# Patient Record
Sex: Female | Born: 1975 | Race: Black or African American | Hispanic: No | Marital: Single | Smoking: Never smoker
Health system: Southern US, Community
[De-identification: ages and names within clinical notes are randomized; demographics above are authoritative.]

## PROBLEM LIST (undated history)

## (undated) DIAGNOSIS — I10 Essential (primary) hypertension: Secondary | ICD-10-CM

## (undated) DIAGNOSIS — E669 Obesity, unspecified: Secondary | ICD-10-CM

## (undated) DIAGNOSIS — G43009 Migraine without aura, not intractable, without status migrainosus: Secondary | ICD-10-CM

## (undated) HISTORY — PX: WISDOM TOOTH EXTRACTION: SHX21

## (undated) HISTORY — DX: Migraine without aura, not intractable, without status migrainosus: G43.009

## (undated) HISTORY — PX: PLANTAR FASCIA RELEASE: SHX2239

## (undated) HISTORY — DX: Obesity, unspecified: E66.9

---

## 1997-08-04 ENCOUNTER — Other Ambulatory Visit: Admission: RE | Admit: 1997-08-04 | Discharge: 1997-08-04 | Payer: Self-pay | Admitting: *Deleted

## 1998-11-15 ENCOUNTER — Other Ambulatory Visit: Admission: RE | Admit: 1998-11-15 | Discharge: 1998-11-15 | Payer: Self-pay | Admitting: Obstetrics and Gynecology

## 2000-07-06 ENCOUNTER — Encounter: Payer: Self-pay | Admitting: Obstetrics and Gynecology

## 2000-07-06 ENCOUNTER — Encounter: Admission: RE | Admit: 2000-07-06 | Discharge: 2000-07-06 | Payer: Self-pay | Admitting: Obstetrics and Gynecology

## 2001-07-12 ENCOUNTER — Other Ambulatory Visit: Admission: RE | Admit: 2001-07-12 | Discharge: 2001-07-12 | Payer: Self-pay | Admitting: Obstetrics and Gynecology

## 2002-07-28 ENCOUNTER — Other Ambulatory Visit: Admission: RE | Admit: 2002-07-28 | Discharge: 2002-07-28 | Payer: Self-pay | Admitting: Obstetrics and Gynecology

## 2003-10-17 ENCOUNTER — Other Ambulatory Visit: Admission: RE | Admit: 2003-10-17 | Discharge: 2003-10-17 | Payer: Self-pay | Admitting: Obstetrics and Gynecology

## 2004-08-16 ENCOUNTER — Ambulatory Visit (HOSPITAL_COMMUNITY): Admission: RE | Admit: 2004-08-16 | Discharge: 2004-08-16 | Payer: Self-pay | Admitting: Obstetrics and Gynecology

## 2004-10-17 ENCOUNTER — Other Ambulatory Visit: Admission: RE | Admit: 2004-10-17 | Discharge: 2004-10-17 | Payer: Self-pay | Admitting: Obstetrics and Gynecology

## 2005-07-19 ENCOUNTER — Emergency Department (HOSPITAL_COMMUNITY): Admission: EM | Admit: 2005-07-19 | Discharge: 2005-07-20 | Payer: Self-pay | Admitting: Emergency Medicine

## 2007-06-09 ENCOUNTER — Emergency Department (HOSPITAL_COMMUNITY): Admission: EM | Admit: 2007-06-09 | Discharge: 2007-06-09 | Payer: Self-pay | Admitting: Family Medicine

## 2007-12-10 ENCOUNTER — Emergency Department (HOSPITAL_COMMUNITY): Admission: EM | Admit: 2007-12-10 | Discharge: 2007-12-10 | Payer: Self-pay | Admitting: Emergency Medicine

## 2010-10-03 ENCOUNTER — Inpatient Hospital Stay (INDEPENDENT_AMBULATORY_CARE_PROVIDER_SITE_OTHER)
Admission: RE | Admit: 2010-10-03 | Discharge: 2010-10-03 | Disposition: A | Discharge status: Home / Self Care | Payer: 59 | Source: Ambulatory Visit | Attending: Family Medicine | Admitting: Family Medicine

## 2010-10-03 DIAGNOSIS — L988 Other specified disorders of the skin and subcutaneous tissue: Secondary | ICD-10-CM

## 2010-12-23 LAB — POCT RAPID STREP A: Streptococcus, Group A Screen (Direct): NEGATIVE

## 2011-01-01 LAB — BASIC METABOLIC PANEL
CO2: 25
Calcium: 9.1
Chloride: 107
GFR calc Af Amer: >60
Glucose, Bld: 94
Sodium: 137

## 2011-01-01 LAB — DIFFERENTIAL
Basophils Absolute: 0.1
Basophils Relative: 1
Eosinophils Absolute: 0.1
Eosinophils Relative: 1
Lymphocytes Relative: 17
Lymphs Abs: 1.6
Monocytes Absolute: 0.6
Monocytes Relative: 6
Neutro Abs: 7
Neutrophils Relative %: 75

## 2011-01-01 LAB — CBC
HCT: 37.6
Hemoglobin: 12
MCHC: 32.1
MCV: 76.1 — ABNORMAL LOW
Platelets: 215
RBC: 4.94
RDW: 14.9
WBC: 9.3

## 2011-01-01 LAB — WET PREP, GENITAL: Yeast Wet Prep HPF POC: NONE SEEN

## 2011-01-01 LAB — BASIC METABOLIC PANEL WITH GFR
BUN: 10
Creatinine, Ser: 0.88
GFR calc non Af Amer: >60
Potassium: 4.1

## 2011-01-01 LAB — URINALYSIS, ROUTINE W REFLEX MICROSCOPIC
Bilirubin Urine: NEGATIVE
Hgb urine dipstick: NEGATIVE
Ketones, ur: NEGATIVE
Nitrite: NEGATIVE
Urobilinogen, UA: 1

## 2011-02-18 ENCOUNTER — Encounter: Payer: Self-pay | Admitting: *Deleted

## 2011-02-18 ENCOUNTER — Emergency Department (INDEPENDENT_AMBULATORY_CARE_PROVIDER_SITE_OTHER): Payer: 59

## 2011-02-18 ENCOUNTER — Emergency Department (HOSPITAL_BASED_OUTPATIENT_CLINIC_OR_DEPARTMENT_OTHER)
Admission: EM | Admit: 2011-02-18 | Discharge: 2011-02-18 | Disposition: A | Discharge status: Home / Self Care | Payer: 59 | Attending: Emergency Medicine | Admitting: Emergency Medicine

## 2011-02-18 DIAGNOSIS — J40 Bronchitis, not specified as acute or chronic: Secondary | ICD-10-CM

## 2011-02-18 DIAGNOSIS — R059 Cough, unspecified: Secondary | ICD-10-CM | POA: Insufficient documentation

## 2011-02-18 DIAGNOSIS — R0989 Other specified symptoms and signs involving the circulatory and respiratory systems: Secondary | ICD-10-CM

## 2011-02-18 DIAGNOSIS — R05 Cough: Secondary | ICD-10-CM

## 2011-02-18 DIAGNOSIS — R0602 Shortness of breath: Secondary | ICD-10-CM

## 2011-02-18 DIAGNOSIS — Z79899 Other long term (current) drug therapy: Secondary | ICD-10-CM | POA: Insufficient documentation

## 2011-02-18 DIAGNOSIS — J069 Acute upper respiratory infection, unspecified: Secondary | ICD-10-CM | POA: Insufficient documentation

## 2011-02-18 MED ORDER — ALBUTEROL SULFATE HFA 108 (90 BASE) MCG/ACT IN AERS
2.0000 | INHALATION_SPRAY | Freq: Once | RESPIRATORY_TRACT | Status: AC
  Administered 2011-02-18: 2 via RESPIRATORY_TRACT
  Filled 2011-02-18: qty 6.7

## 2011-02-18 MED ORDER — PREDNISONE 50 MG PO TABS
60.0000 mg | ORAL_TABLET | Freq: Once | ORAL | Status: AC
  Administered 2011-02-18: 60 mg via ORAL
  Filled 2011-02-18: qty 1

## 2011-02-18 MED ORDER — PREDNISONE 20 MG PO TABS: 40.0000 mg | ORAL_TABLET | Freq: Every day | ORAL | Status: AC

## 2011-02-18 MED ORDER — HYDROCOD POLST-CHLORPHEN POLST 10-8 MG/5ML PO LQCR: 5.0000 mL | Freq: Two times a day (BID) | ORAL | Status: DC | PRN

## 2011-02-18 NOTE — ED Provider Notes (Signed)
Medical screening examination/treatment/procedure(s) were performed by non-physician practitioner and as supervising physician I was immediately available for consultation/collaboration.   Tita Terhaar M Lacresia Darwish, MD 02/18/11 2310 

## 2011-02-18 NOTE — ED Notes (Signed)
Pt c/o cold  symptoms x 3 weeks w/o relief with z pak

## 2011-02-18 NOTE — ED Provider Notes (Signed)
History     CSN: [id number] Arrival date & time: 02/18/2011  6:19 PM   First MD Initiated Contact with Patient 02/18/11 1824      Chief Complaint  Patient presents with  . URI    (Consider location/radiation/quality/duration/timing/severity/associated sxs/prior treatment) Patient is a 35 y.o. female presenting with URI. The history is provided by the patient.  URI The primary symptoms include cough. Primary symptoms do not include fever, headaches, ear pain, sore throat, wheezing, abdominal pain, nausea, vomiting or rash. The current episode started more than 1 week ago. The problem has not changed since onset. The illness is not associated with chills, sinus pressure, congestion or rhinorrhea.  Pt states she has had cough for 3 weeks. Was seen by PCP a week ago, was started on Z-pack and given cough medications. States cough is not improving, but worsening. Pt unable to sleep at night. Denies fever, chills, other URI symptoms.   History reviewed. No pertinent past medical history.  History reviewed. No pertinent past surgical history.  History reviewed. No pertinent family history.  History  Substance Use Topics  . Smoking status: Never Smoker   . Smokeless tobacco: Not on file  . Alcohol Use: No    OB History    Grav Para Term Preterm Abortions TAB SAB Ect Mult Living                  Review of Systems  Constitutional: Negative for fever and chills.  HENT: Negative for ear pain, congestion, sore throat, rhinorrhea, sneezing, trouble swallowing and sinus pressure.   Eyes: Negative.   Respiratory: Positive for cough. Negative for chest tightness, shortness of breath and wheezing.   Cardiovascular: Negative for chest pain, palpitations and leg swelling.  Gastrointestinal: Negative for nausea, vomiting and abdominal pain.  Genitourinary: Negative.   Musculoskeletal: Negative.   Skin: Negative for rash.  Neurological: Negative for headaches.  Psychiatric/Behavioral:  Negative.     Allergies  Penicillins cross reactors  Home Medications   Current Outpatient Rx  Name Route Sig Dispense Refill  . AZITHROMYCIN 250 MG PO TABS Oral Take 250 mg by mouth daily.      . GUAIFENESIN-CODEINE 100-10 MG/5ML PO SYRP Oral Take 10 mLs by mouth 3 (three) times daily as needed. For cough     . IBUPROFEN 200 MG PO TABS Oral Take 400 mg by mouth every 6 (six) hours as needed. For pain     . NAPROXEN SODIUM 550 MG PO TABS Oral Take 550 mg by mouth 2 (two) times daily with a meal.      . HYDROCOD POLST-CHLORPHEN POLST 10-8 MG/5ML PO LQCR Oral Take 5 mLs by mouth every 12 (twelve) hours as needed. 140 mL 0  . PREDNISONE 20 MG PO TABS Oral Take 2 tablets (40 mg total) by mouth daily. 10 tablet 0    BP 145/100  Pulse 99  Temp 98.7 F (37.1 C)  Resp 16  Ht 5\' 10"  (1.778 m)  Wt 265 lb (120.203 kg)  BMI 38.02 kg/m2  SpO2 100%  LMP 01/23/2011  Physical Exam  Nursing note and vitals reviewed. Constitutional: She is oriented to person, place, and time. She appears well-developed and well-nourished.       coughing  Neck: Neck supple.  Cardiovascular: Normal rate, regular rhythm and normal heart sounds.   Pulmonary/Chest: Effort normal. She has no wheezes. She has no rales. She exhibits no tenderness.       Tachypnic, decreased air movement bilaterally  Abdominal: Soft. Bowel sounds are normal. There is no tenderness.  Neurological: She is alert and oriented to person, place, and time.  Skin: Skin is warm and dry. No rash noted.  Psychiatric: She has a normal mood and affect.    ED Course  Procedures (including critical care time)  Labs Reviewed - No data to display Dg Chest 2 View  02/18/2011  *RADIOLOGY REPORT*  Clinical Data: Cough.  Shortness of breath.  Chest congestion.  3- week history of these symptoms.  CHEST - 2 VIEW 02/18/2011:  Comparison: None.  Findings: Cardiomediastinal silhouette unremarkable.  Mild to moderate central peribronchial thickening.   Lungs otherwise clear. No pleural effusions.  Visualized bony thorax intact.  IMPRESSION: Mild to moderate changes of bronchitis and/or asthma.  No acute cardiopulmonary disease otherwise.  Original Report Authenticated By: Arnell Sieving, M.D.   Pt finished z-pack and cough meds, symptoms worsening. Pt does not have fever, chills. Cough sounds dry, suspect bronchospasms. Will treat with albuterol and prednisone. Two puffs of albuterol given in ED, which stopped pt's cough. Pt feeling slightly better. Will d/c with follow up.   1. Bronchitis       MDM          Lottie Mussel, PA 02/18/11 2248

## 2011-06-23 ENCOUNTER — Emergency Department (HOSPITAL_BASED_OUTPATIENT_CLINIC_OR_DEPARTMENT_OTHER)
Admission: EM | Admit: 2011-06-23 | Discharge: 2011-06-23 | Disposition: A | Discharge status: Home / Self Care | Payer: 59 | Attending: Emergency Medicine | Admitting: Emergency Medicine

## 2011-06-23 ENCOUNTER — Encounter (HOSPITAL_BASED_OUTPATIENT_CLINIC_OR_DEPARTMENT_OTHER): Payer: Self-pay | Admitting: Emergency Medicine

## 2011-06-23 DIAGNOSIS — R112 Nausea with vomiting, unspecified: Secondary | ICD-10-CM | POA: Insufficient documentation

## 2011-06-23 DIAGNOSIS — R109 Unspecified abdominal pain: Secondary | ICD-10-CM | POA: Insufficient documentation

## 2011-06-23 LAB — URINALYSIS, ROUTINE W REFLEX MICROSCOPIC
Glucose, UA: NEGATIVE mg/dL
Ketones, ur: NEGATIVE mg/dL
Leukocytes, UA: NEGATIVE
Nitrite: NEGATIVE
Specific Gravity, Urine: 1.016 (ref 1.005–1.030)
pH: 6.5 (ref 5.0–8.0)

## 2011-06-23 LAB — PREGNANCY, URINE: Preg Test, Ur: NEGATIVE

## 2011-06-23 MED ORDER — ONDANSETRON HCL 4 MG/2ML IJ SOLN
4.0000 mg | Freq: Once | INTRAMUSCULAR | Status: AC
  Administered 2011-06-23: 4 mg via INTRAVENOUS
  Filled 2011-06-23: qty 2

## 2011-06-23 MED ORDER — KETOROLAC TROMETHAMINE 30 MG/ML IJ SOLN
30.0000 mg | Freq: Once | INTRAMUSCULAR | Status: AC
  Administered 2011-06-23: 30 mg via INTRAVENOUS
  Filled 2011-06-23: qty 1

## 2011-06-23 MED ORDER — SODIUM CHLORIDE 0.9 % IV BOLUS (SEPSIS)
1000.0000 mL | Freq: Once | INTRAVENOUS | Status: AC
  Administered 2011-06-23: 1000 mL via INTRAVENOUS

## 2011-06-23 MED ORDER — ONDANSETRON HCL 4 MG PO TABS: 4.0000 mg | ORAL_TABLET | Freq: Four times a day (QID) | ORAL | Status: AC

## 2011-06-23 NOTE — ED Notes (Signed)
Pt c/o lower abdomen pain and vomiting since 3am.

## 2011-06-23 NOTE — Discharge Instructions (Signed)
Abdominal Pain Abdominal pain can be caused by many things. Your caregiver decides the seriousness of your pain by an examination and possibly blood tests and X-rays. Many cases can be observed and treated at home. Most abdominal pain is not caused by a disease and will probably improve without treatment. However, in many cases, more time must pass before a clear cause of the pain can be found. Before that point, it may not be known if you need more testing, or if hospitalization or surgery is needed. HOME CARE INSTRUCTIONS   Do not take laxatives unless directed by your caregiver.   Take pain medicine only as directed by your caregiver.   Only take over-the-counter or prescription medicines for pain, discomfort, or fever as directed by your caregiver.   Try a clear liquid diet (broth, tea, or water) for as long as directed by your caregiver. Slowly move to a bland diet as tolerated.  SEEK IMMEDIATE MEDICAL CARE IF:   The pain does not go away.   You have a fever.   You keep throwing up (vomiting).   The pain is felt only in portions of the abdomen. Pain in the right side could possibly be appendicitis. In an adult, pain in the left lower portion of the abdomen could be colitis or diverticulitis.   You pass bloody or black tarry stools.  MAKE SURE YOU:   Understand these instructions.   Will watch your condition.   Will get help right away if you are not doing well or get worse.  Document Released: 12/25/2004 Document Revised: 03/06/2011 Document Reviewed: 11/03/2007 [employer] Patient Information 2012 [employer], [place]  Viral Gastroenteritis Viral gastroenteritis is also known as stomach flu. This condition affects the stomach and intestinal tract. It can cause sudden diarrhea and vomiting. The illness typically lasts 3 to 8 days. Most people develop an immune response that eventually gets rid of the virus. While this natural response develops, the virus can make you quite ill. CAUSES    Many different viruses can cause gastroenteritis, such as rotavirus or noroviruses. You can catch one of these viruses by consuming contaminated food or water. You may also catch a virus by sharing utensils or other personal items with an infected person or by touching a contaminated surface. SYMPTOMS  The most common symptoms are diarrhea and vomiting. These problems can cause a severe loss of body fluids (dehydration) and a body salt (electrolyte) imbalance. Other symptoms may include:  Fever.   Headache.   Fatigue.   Abdominal pain.  DIAGNOSIS  Your caregiver can usually diagnose viral gastroenteritis based on your symptoms and a physical exam. A stool sample may also be taken to test for the presence of viruses or other infections. TREATMENT  This illness typically goes away on its own. Treatments are aimed at rehydration. The most serious cases of viral gastroenteritis involve vomiting so severely that you are not able to keep fluids down. In these cases, fluids must be given through an intravenous line (IV). HOME CARE INSTRUCTIONS   Drink enough fluids to keep your urine clear or pale yellow. Drink small amounts of fluids frequently and increase the amounts as tolerated.   Ask your caregiver for specific rehydration instructions.   Avoid:   Foods high in sugar.   Alcohol.   Carbonated drinks.   Tobacco.   Juice.   Caffeine drinks.   Extremely hot or cold fluids.   Fatty, greasy foods.   Too much intake of anything at one time.  Dairy products until 24 to 48 hours after diarrhea stops.   You may consume probiotics. Probiotics are active cultures of beneficial bacteria. They may lessen the amount and number of diarrheal stools in adults. Probiotics can be found in yogurt with active cultures and in supplements.   Wash your hands well to avoid spreading the virus.   Only take over-the-counter or prescription medicines for pain, discomfort, or fever as directed by  your caregiver. Do not give aspirin to children. Antidiarrheal medicines are not recommended.   Ask your caregiver if you should continue to take your regular prescribed and over-the-counter medicines.   Keep all follow-up appointments as directed by your caregiver.  SEEK IMMEDIATE MEDICAL CARE IF:   You are unable to keep fluids down.   You do not urinate at least once every 6 to 8 hours.   You develop shortness of breath.   You notice blood in your stool or vomit. This may look like coffee grounds.   You have abdominal pain that increases or is concentrated in one small area (localized).   You have persistent vomiting or diarrhea.   You have a fever.   The patient is a child younger than 3 months, and he or she has a fever.   The patient is a child older than 3 months, and he or she has a fever and persistent symptoms.   The patient is a child older than 3 months, and he or she has a fever and symptoms suddenly get worse.   The patient is a baby, and he or she has no tears when crying.  MAKE SURE YOU:   Understand these instructions.   Will watch your condition.   Will get help right away if you are not doing well or get worse.  Document Released: 03/17/2005 Document Revised: 03/06/2011 Document Reviewed: 01/01/2011 [facility] Patient Information 2012 [place], [place].[name]

## 2011-06-23 NOTE — ED Provider Notes (Signed)
History     CSN: [id number]  Arrival date & time 06/23/11  0709   First MD Initiated Contact with Patient 06/23/11 0719      No chief complaint on file.   (Consider location/radiation/quality/duration/timing/severity/associated sxs/prior treatment) HPI Comments: Patient presents with lower abdominal pain and nausea and vomiting since 3 AM.  No diarrhea.  No sick contacts.  Patient felt well last night.  No fevers.  Patient denies dysuria.  Her last menstrual period was the end of last month although she notes she's been spotting this last week.  Patient is a 36 y.o. female presenting with GI illlness. The history is provided by the patient. No language interpreter was used.  GI Problem  This is a new problem. The current episode started 3 to 5 hours ago. The problem occurs 5 to 10 times per day. The problem has not changed since onset.There has been no fever. Associated symptoms include abdominal pain and vomiting. Pertinent negatives include no chills, no sweats, no headaches, no arthralgias, no myalgias, no URI and no cough. She has tried nothing for the symptoms.    History reviewed. No pertinent past medical history.  History reviewed. No pertinent past surgical history.  History reviewed. No pertinent family history.  History  Substance Use Topics  . Smoking status: Never Smoker   . Smokeless tobacco: Not on file  . Alcohol Use: No    OB History    Grav Para Term Preterm Abortions TAB SAB Ect Mult Living                  Review of Systems  Constitutional: Negative.  Negative for fever and chills.  HENT: Negative.   Eyes: Negative.  Negative for discharge and redness.  Respiratory: Negative.  Negative for cough and shortness of breath.   Cardiovascular: Negative.  Negative for chest pain.  Gastrointestinal: Positive for nausea, vomiting and abdominal pain. Negative for diarrhea.  Genitourinary: Negative.  Negative for dysuria and vaginal discharge.  Musculoskeletal:  Negative.  Negative for myalgias, back pain and arthralgias.  Skin: Negative.  Negative for color change and rash.  Neurological: Negative.  Negative for syncope and headaches.  Hematological: Negative.  Negative for adenopathy.  Psychiatric/Behavioral: Negative.  Negative for confusion.  All other systems reviewed and are negative.    Allergies  Penicillins cross reactors  Home Medications   Current Outpatient Rx  Name Route Sig Dispense Refill  . AZITHROMYCIN 250 MG PO TABS Oral Take 250 mg by mouth daily.      [name] HYDROCOD POLST-CPM POLST ER 10-8 MG/5ML PO LQCR Oral Take 5 mLs by mouth every 12 (twelve) hours as needed. 140 mL 0  . GUAIFENESIN-CODEINE 100-10 MG/5ML PO SYRP Oral Take 10 mLs by mouth 3 (three) times daily as needed. For cough     . IBUPROFEN 200 MG PO TABS Oral Take 400 mg by mouth every 6 (six) hours as needed. For pain     . NAPROXEN SODIUM 550 MG PO TABS Oral Take 550 mg by mouth 2 (two) times daily with a meal.        BP 129/88  Pulse 102  Temp(Src) 98.6 F (37 C) (Oral)  Resp 20  Ht 5\' 9"  (1.753 m)  Wt 265 lb (120.203 kg)  BMI 39.13 kg/m2  SpO2 98%  Physical Exam  Nursing note and vitals reviewed. Constitutional: She is oriented to person, place, and time. She appears well-developed and well-nourished.  Non-toxic appearance. She does not have a  sickly appearance.  HENT:  Head: Normocephalic and atraumatic.  Eyes: Conjunctivae, EOM and lids are normal. Pupils are equal, round, and reactive to light. No scleral icterus.  Neck: Trachea normal and normal range of motion. Neck supple.  Cardiovascular: Normal rate, regular rhythm and normal heart sounds.   Pulmonary/Chest: Effort normal and breath sounds normal. No respiratory distress. She has no wheezes. She has no rales.  Abdominal: Soft. Normal appearance. She exhibits no distension. There is no tenderness. There is no rebound, no guarding and no CVA tenderness.  Musculoskeletal: Normal range of motion.    Neurological: She is alert and oriented to person, place, and time. She has normal strength.  Skin: Skin is warm, dry and intact. No rash noted.  Psychiatric: She has a normal mood and affect. Her behavior is normal. Judgment and thought content normal.    ED Course  Procedures (including critical care time)  Results for orders placed during the hospital encounter of 06/23/11  URINALYSIS, ROUTINE W REFLEX MICROSCOPIC      Component Value Range   Color, Urine YELLOW  YELLOW    APPearance CLEAR  CLEAR    Specific Gravity, Urine 1.016  1.005 - 1.030    pH 6.5  5.0 - 8.0    Glucose, UA NEGATIVE  NEGATIVE (mg/dL)   Hgb urine dipstick NEGATIVE  NEGATIVE    Bilirubin Urine NEGATIVE  NEGATIVE    Ketones, ur NEGATIVE  NEGATIVE (mg/dL)   Protein, ur NEGATIVE  NEGATIVE (mg/dL)   Urobilinogen, UA 0.2  0.0 - 1.0 (mg/dL)   Nitrite NEGATIVE  NEGATIVE    Leukocytes, UA NEGATIVE  NEGATIVE   PREGNANCY, URINE      Component Value Range   Preg Test, Ur NEGATIVE  NEGATIVE       MDM  Patient with likely viral gastroenteritis given her symptoms with nausea and vomiting this morning.  Patient does not have an ectopic pregnancy as her pregnancy test is negative and does not have a UTI.  Her abdominal exam is benign and shows no signs of focal right lower quadrant pain to be suspicious for acute appendicitis at this time.  Patient will be given a prescription for antiemetics at home and should return if her symptoms persist.        Nat Christen, MD 06/23/11 [phone]

## 2012-03-26 ENCOUNTER — Emergency Department (HOSPITAL_BASED_OUTPATIENT_CLINIC_OR_DEPARTMENT_OTHER)
Admission: EM | Admit: 2012-03-26 | Discharge: 2012-03-26 | Disposition: A | Discharge status: Home / Self Care | Payer: 59 | Attending: Emergency Medicine | Admitting: Emergency Medicine

## 2012-03-26 ENCOUNTER — Encounter (HOSPITAL_BASED_OUTPATIENT_CLINIC_OR_DEPARTMENT_OTHER): Payer: Self-pay | Admitting: *Deleted

## 2012-03-26 DIAGNOSIS — R51 Headache: Secondary | ICD-10-CM | POA: Insufficient documentation

## 2012-03-26 DIAGNOSIS — R197 Diarrhea, unspecified: Secondary | ICD-10-CM | POA: Insufficient documentation

## 2012-03-26 DIAGNOSIS — K529 Noninfective gastroenteritis and colitis, unspecified: Secondary | ICD-10-CM

## 2012-03-26 DIAGNOSIS — K5289 Other specified noninfective gastroenteritis and colitis: Secondary | ICD-10-CM | POA: Insufficient documentation

## 2012-03-26 DIAGNOSIS — IMO0001 Reserved for inherently not codable concepts without codable children: Secondary | ICD-10-CM | POA: Insufficient documentation

## 2012-03-26 DIAGNOSIS — R509 Fever, unspecified: Secondary | ICD-10-CM | POA: Insufficient documentation

## 2012-03-26 LAB — COMPREHENSIVE METABOLIC PANEL
ALT: 16 U/L (ref 0–35)
AST: 19 U/L (ref 0–37)
Alkaline Phosphatase: 109 U/L (ref 39–117)
CO2: 24 mEq/L (ref 19–32)
Calcium: 9.3 mg/dL (ref 8.4–10.5)
GFR calc non Af Amer: 82 mL/min — ABNORMAL LOW (ref >90)
Glucose, Bld: 104 mg/dL — ABNORMAL HIGH (ref 70–99)
Potassium: 4.3 mEq/L (ref 3.5–5.1)
Sodium: 138 mEq/L (ref 135–145)

## 2012-03-26 LAB — CBC
Hemoglobin: 12.9 g/dL (ref 12.0–15.0)
MCH: 23.8 pg — ABNORMAL LOW (ref 26.0–34.0)
Platelets: 219 10*3/uL (ref 150–400)
RBC: 5.43 MIL/uL — ABNORMAL HIGH (ref 3.87–5.11)
WBC: 10 10*3/uL (ref 4.0–10.5)

## 2012-03-26 MED ORDER — LOPERAMIDE HCL 2 MG PO CAPS: 2.0000 mg | ORAL_CAPSULE | Freq: Four times a day (QID) | ORAL | Status: DC | PRN

## 2012-03-26 MED ORDER — SODIUM CHLORIDE 0.9 % IV BOLUS (SEPSIS)
1000.0000 mL | Freq: Once | INTRAVENOUS | Status: AC
  Administered 2012-03-26: 1000 mL via INTRAVENOUS

## 2012-03-26 MED ORDER — MORPHINE SULFATE 4 MG/ML IJ SOLN
4.0000 mg | Freq: Once | INTRAMUSCULAR | Status: AC
  Administered 2012-03-26: 4 mg via INTRAVENOUS
  Filled 2012-03-26: qty 1

## 2012-03-26 MED ORDER — ONDANSETRON HCL 4 MG PO TABS: 4.0000 mg | ORAL_TABLET | Freq: Four times a day (QID) | ORAL | Status: DC

## 2012-03-26 MED ORDER — IBUPROFEN 400 MG PO TABS
400.0000 mg | ORAL_TABLET | Freq: Once | ORAL | Status: AC
  Administered 2012-03-26: 400 mg via ORAL
  Filled 2012-03-26: qty 1

## 2012-03-26 MED ORDER — ONDANSETRON HCL 4 MG/2ML IJ SOLN
4.0000 mg | Freq: Once | INTRAMUSCULAR | Status: AC
  Administered 2012-03-26: 4 mg via INTRAVENOUS
  Filled 2012-03-26: qty 2

## 2012-03-26 MED ORDER — METOCLOPRAMIDE HCL 5 MG/ML IJ SOLN
INTRAMUSCULAR | Status: AC
  Administered 2012-03-26: 10 mg via INTRAVENOUS
  Filled 2012-03-26: qty 2

## 2012-03-26 MED ORDER — METOCLOPRAMIDE HCL 5 MG/ML IJ SOLN
10.0000 mg | Freq: Once | INTRAMUSCULAR | Status: AC
  Administered 2012-03-26: 10 mg via INTRAVENOUS

## 2012-03-26 NOTE — ED Notes (Signed)
Pt amb to triage with quick steady gait in nad. Pt reports n/v/d x this am with stomach cramps.

## 2012-03-26 NOTE — ED Provider Notes (Signed)
History     CSN: [id number]  Arrival date & time 03/26/12  1610   First MD Initiated Contact with Patient 03/26/12 1652      Chief Complaint  Patient presents with  . Nausea  . Emesis  . Diarrhea    (Consider location/radiation/quality/duration/timing/severity/associated sxs/prior treatment) Patient is a 36 y.o. female presenting with vomiting and diarrhea. The history is provided by the patient and the spouse. No language interpreter was used.  Emesis  This is a new problem. The current episode started 6 to 12 hours ago. The problem occurs 5 to 10 times per day. The problem has not changed since onset.Associated symptoms include diarrhea, a fever, headaches and myalgias. Associated symptoms comments: 5-10 times.  Diarrhea The primary symptoms include fever, vomiting, diarrhea and myalgias.    History reviewed. No pertinent past medical history.  History reviewed. No pertinent past surgical history.  History reviewed. No pertinent family history.  History  Substance Use Topics  . Smoking status: Never Smoker   . Smokeless tobacco: Not on file  . Alcohol Use: No    OB History    Grav Para Term Preterm Abortions TAB SAB Ect Mult Living                  Review of Systems  Constitutional: Positive for fever.  Eyes: Negative.   Respiratory: Negative.   Cardiovascular: Negative.   Gastrointestinal: Positive for vomiting and diarrhea.  Musculoskeletal: Positive for myalgias.  Neurological: Positive for headaches. Negative for dizziness and light-headedness.  Psychiatric/Behavioral: Negative.   All other systems reviewed and are negative.    Allergies  Codeine and Penicillins cross reactors  Home Medications   Current Outpatient Rx  Name  Route  Sig  Dispense  Refill  . AZITHROMYCIN 250 MG PO TABS   Oral   Take 250 mg by mouth daily.           [name] HYDROCOD POLST-CPM POLST ER 10-8 MG/5ML PO LQCR   Oral   Take 5 mLs by mouth every 12 (twelve) hours as  needed.   140 mL   0   . GUAIFENESIN-CODEINE 100-10 MG/5ML PO SYRP   Oral   Take 10 mLs by mouth 3 (three) times daily as needed. For cough          . IBUPROFEN 200 MG PO TABS   Oral   Take 400 mg by mouth every 6 (six) hours as needed. For pain          . NAPROXEN SODIUM 550 MG PO TABS   Oral   Take 550 mg by mouth 2 (two) times daily with a meal.             BP 135/84  Pulse 104  Temp 99.2 F (37.3 C) (Oral)  Resp 18  SpO2 100%  LMP 02/25/2012  Physical Exam  Nursing note and vitals reviewed. Constitutional: She is oriented to person, place, and time. She appears well-developed and well-nourished.  HENT:  Head: Normocephalic and atraumatic.  Eyes: Conjunctivae normal and EOM are normal. Pupils are equal, round, and reactive to light.  Neck: Normal range of motion. Neck supple.  Cardiovascular: Normal rate.   Pulmonary/Chest: Effort normal.  Abdominal: Soft. Bowel sounds are normal. She exhibits distension. There is no tenderness. There is no rebound and no guarding.  Musculoskeletal: Normal range of motion. She exhibits no edema and no tenderness.  Neurological: She is alert and oriented to person, place, and time. She has normal  reflexes.  Skin: Skin is warm and dry.  Psychiatric: She has a normal mood and affect.    ED Course  Procedures (including critical care time)   Labs Reviewed  CBC  COMPREHENSIVE METABOLIC PANEL   No results found.   No diagnosis found.    MDM  Nausea vomiting diarrhea subjective fever since early this morning. Zofran and Reglan in the ER with IV fluid bolus. Tolerating po's.  Patient is ready for discharge. [facility] Renato Gails. Prescription for Zofran and Imodium. Patient was instructed to return to the ER for uncontrolled nausea vomiting high fever.        Remi Haggard, NP 03/27/12 1032

## 2012-03-30 NOTE — ED Provider Notes (Signed)
Medical screening examination/treatment/procedure(s) were performed by non-physician practitioner and as supervising physician I was immediately available for consultation/collaboration.  Ondre Salvetti, MD 03/30/12 1356 

## 2012-06-08 ENCOUNTER — Emergency Department (HOSPITAL_COMMUNITY): Payer: 59

## 2012-06-08 ENCOUNTER — Emergency Department (HOSPITAL_COMMUNITY)
Admission: EM | Admit: 2012-06-08 | Discharge: 2012-06-08 | Disposition: A | Discharge status: Home / Self Care | Payer: 59 | Attending: Emergency Medicine | Admitting: Emergency Medicine

## 2012-06-08 ENCOUNTER — Encounter (HOSPITAL_COMMUNITY): Payer: Self-pay | Admitting: Emergency Medicine

## 2012-06-08 DIAGNOSIS — R109 Unspecified abdominal pain: Secondary | ICD-10-CM

## 2012-06-08 DIAGNOSIS — R1013 Epigastric pain: Secondary | ICD-10-CM | POA: Insufficient documentation

## 2012-06-08 DIAGNOSIS — R509 Fever, unspecified: Secondary | ICD-10-CM | POA: Insufficient documentation

## 2012-06-08 DIAGNOSIS — R112 Nausea with vomiting, unspecified: Secondary | ICD-10-CM | POA: Insufficient documentation

## 2012-06-08 DIAGNOSIS — Z3202 Encounter for pregnancy test, result negative: Secondary | ICD-10-CM | POA: Insufficient documentation

## 2012-06-08 DIAGNOSIS — R197 Diarrhea, unspecified: Secondary | ICD-10-CM | POA: Insufficient documentation

## 2012-06-08 DIAGNOSIS — R51 Headache: Secondary | ICD-10-CM | POA: Insufficient documentation

## 2012-06-08 LAB — CBC WITH DIFFERENTIAL/PLATELET
Basophils Absolute: 0 10*3/uL (ref 0.0–0.1)
Basophils Relative: 0 % (ref 0–1)
Eosinophils Absolute: 0 10*3/uL (ref 0.0–0.7)
Eosinophils Relative: 0 % (ref 0–5)
HCT: 38.6 % (ref 36.0–46.0)
Hemoglobin: 12.7 g/dL (ref 12.0–15.0)
Lymphocytes Relative: 6 % — ABNORMAL LOW (ref 12–46)
Lymphs Abs: 0.6 10*3/uL — ABNORMAL LOW (ref 0.7–4.0)
MCH: 23.6 pg — ABNORMAL LOW (ref 26.0–34.0)
MCHC: 32.9 g/dL (ref 30.0–36.0)
MCV: 71.6 fL — ABNORMAL LOW (ref 78.0–100.0)
Monocytes Absolute: 0.4 10*3/uL (ref 0.1–1.0)
Monocytes Relative: 4 % (ref 3–12)
Neutro Abs: 7.9 10*3/uL — ABNORMAL HIGH (ref 1.7–7.7)
Neutrophils Relative %: 89 % — ABNORMAL HIGH (ref 43–77)
Platelets: 211 10*3/uL (ref 150–400)
RBC: 5.39 MIL/uL — ABNORMAL HIGH (ref 3.87–5.11)
RDW: 15.1 % (ref 11.5–15.5)
WBC: 8.9 10*3/uL (ref 4.0–10.5)

## 2012-06-08 LAB — BASIC METABOLIC PANEL
BUN: 11 mg/dL (ref 6–23)
CO2: 22 mEq/L (ref 19–32)
Calcium: 9.2 mg/dL (ref 8.4–10.5)
Chloride: 100 mEq/L (ref 96–112)
Creatinine, Ser: 0.81 mg/dL (ref 0.50–1.10)
GFR calc Af Amer: >90 mL/min (ref >90)
GFR calc non Af Amer: >90 mL/min (ref >90)
Glucose, Bld: 99 mg/dL (ref 70–99)
Potassium: 3.8 mEq/L (ref 3.5–5.1)
Sodium: 134 mEq/L — ABNORMAL LOW (ref 135–145)

## 2012-06-08 LAB — URINALYSIS, ROUTINE W REFLEX MICROSCOPIC
Bilirubin Urine: NEGATIVE
Glucose, UA: NEGATIVE mg/dL
Hgb urine dipstick: NEGATIVE
Ketones, ur: NEGATIVE mg/dL
Nitrite: NEGATIVE
Protein, ur: NEGATIVE mg/dL
Specific Gravity, Urine: 1.026 (ref 1.005–1.030)
Urobilinogen, UA: 1 mg/dL (ref 0.0–1.0)
pH: 5.5 (ref 5.0–8.0)

## 2012-06-08 LAB — URINE MICROSCOPIC-ADD ON

## 2012-06-08 LAB — POCT PREGNANCY, URINE: Preg Test, Ur: NEGATIVE

## 2012-06-08 MED ORDER — ONDANSETRON HCL 4 MG/2ML IJ SOLN
4.0000 mg | Freq: Once | INTRAMUSCULAR | Status: AC
  Administered 2012-06-08: 4 mg via INTRAVENOUS
  Filled 2012-06-08: qty 2

## 2012-06-08 MED ORDER — KETOROLAC TROMETHAMINE 30 MG/ML IJ SOLN
30.0000 mg | Freq: Once | INTRAMUSCULAR | Status: AC
  Administered 2012-06-08: 30 mg via INTRAVENOUS
  Filled 2012-06-08: qty 1

## 2012-06-08 MED ORDER — SODIUM CHLORIDE 0.9 % IV BOLUS (SEPSIS)
1000.0000 mL | Freq: Once | INTRAVENOUS | Status: AC
  Administered 2012-06-08: 1000 mL via INTRAVENOUS

## 2012-06-08 MED ORDER — ONDANSETRON 8 MG PO TBDP: 8.0000 mg | ORAL_TABLET | Freq: Three times a day (TID) | ORAL | Status: DC | PRN

## 2012-06-08 NOTE — ED Notes (Addendum)
Pt c/o abd pain and nausea that started today around 6am. Pt reports the pain in generalized, cannot pinpoint it. Pt c/o n/v/d/fever all started at 6am this morning. Pt reports she is unable to keep food/fluids down. LBM today. LMP in January, pt reports it is normally irregular. Pt in nad, ambulatory to room, skin warm and dry, resp e/u.

## 2012-06-08 NOTE — ED Provider Notes (Signed)
History     CSN: [id number]  Arrival date & time 06/08/12  1458   First MD Initiated Contact with Patient 06/08/12 1822      Chief Complaint  Patient presents with  . Abdominal Pain  . Diarrhea  . Headache    (Consider location/radiation/quality/duration/timing/severity/associated sxs/prior treatment) HPI Comments: Patient presents today with a chief complaint of nausea, vomiting, diarrhea, and abdominal pain. Onset of symptoms earlier today.   Abdominal pain located in the epigastric area.  Pain does not radiate.  Patient has had a low grade fever.  Tmax 99.9 orally.  Patient reports that she has had approximately 5 episodes of vomiting and 5 episodes of diarrhea.  No blood in her emesis or blood in her stool.  She has not taken anything for her symptoms prior to arrival.  Patient is a 37 y.o. female presenting with abdominal pain, diarrhea, and headaches. The history is provided by the patient.  Abdominal Pain Pain quality: aching   Onset quality:  Gradual Timing:  Constant Context: not previous surgeries   Worsened by:  Nothing tried Associated symptoms: chills, diarrhea, fever, nausea and vomiting   Associated symptoms: no chest pain, no constipation, no dysuria, no hematemesis, no hematochezia, no hematuria, no melena, no shortness of breath, no vaginal bleeding and no vaginal discharge   Vomiting:    Quality:  Stomach contents Risk factors: no alcohol abuse and has not had multiple surgeries   Diarrhea Associated symptoms: abdominal pain, chills, fever, headaches and vomiting   Headache Associated symptoms: abdominal pain, diarrhea, fever, nausea and vomiting     History reviewed. No pertinent past medical history.  History reviewed. No pertinent past surgical history.  No family history on file.  History  Substance Use Topics  . Smoking status: Never Smoker   . Smokeless tobacco: Not on file  . Alcohol Use: No    OB History   Grav Para Term Preterm Abortions  TAB SAB Ect Mult Living                  Review of Systems  Constitutional: Positive for fever and chills.  Respiratory: Negative for shortness of breath.   Cardiovascular: Negative for chest pain.  Gastrointestinal: Positive for nausea, vomiting, abdominal pain and diarrhea. Negative for constipation, blood in stool, melena, hematochezia, abdominal distention and hematemesis.  Genitourinary: Negative for dysuria, urgency, frequency, hematuria, flank pain, decreased urine volume, vaginal bleeding, vaginal discharge and pelvic pain.  Neurological: Positive for headaches.  All other systems reviewed and are negative.    Allergies  Codeine and Penicillins cross reactors  Home Medications  No current outpatient prescriptions on file.  BP 116/71  Pulse 99  Temp(Src) 99.5 F (37.5 C) (Oral)  Resp 18  SpO2 97%  LMP 04/10/2012  Physical Exam  Nursing note and vitals reviewed. Constitutional: She appears well-developed and well-nourished. No distress.  HENT:  Head: Normocephalic and atraumatic.  Mouth/Throat: Oropharynx is clear and moist.  Neck: Normal range of motion. Neck supple.  Cardiovascular: Normal rate, regular rhythm and normal heart sounds.   Pulmonary/Chest: Effort normal and breath sounds normal.  Abdominal: Soft. Normal appearance and bowel sounds are normal. She exhibits no distension and no mass. There is tenderness in the epigastric area. There is no rebound, no guarding, no tenderness at McBurney's point and negative Murphy's sign.  Neurological: She is alert.  Skin: Skin is warm and dry. She is not diaphoretic.  Psychiatric: She has a normal mood and affect.  ED Course  Procedures (including critical care time)  Labs Reviewed  CBC WITH DIFFERENTIAL - Abnormal; Notable for the following:    RBC 5.39 (*)    MCV 71.6 (*)    MCH 23.6 (*)    Neutrophils Relative 89 (*)    Neutro Abs 7.9 (*)    Lymphocytes Relative 6 (*)    Lymphs Abs 0.6 (*)    All  other components within normal limits  BASIC METABOLIC PANEL - Abnormal; Notable for the following:    Sodium 134 (*)    All other components within normal limits  URINALYSIS, ROUTINE W REFLEX MICROSCOPIC - Abnormal; Notable for the following:    Leukocytes, UA TRACE (*)    All other components within normal limits  URINE MICROSCOPIC-ADD ON  POCT PREGNANCY, URINE   Dg Chest 2 View  06/08/2012  *RADIOLOGY REPORT*  Clinical Data: Pain and diarrhea  CHEST - 2 VIEW  Comparison: February 18, 2011  Findings: Lungs clear.  Heart size and pulmonary vascularity are normal.  No adenopathy.  No bone lesions.  IMPRESSION: No abnormality noted.   Original Report Authenticated By: Bretta Bang, M.D.      No diagnosis found.  8:36 PM Patient currently getting IVF.  Patient able to tolerate PO liquids.  MDM  Patient presenting with nausea, vomiting, and diarrhea.  Symptoms began earlier today.  Mild epigastric tenderness on exam.  No rebound or guarding.  Symptoms improved after given Zofran.  Labs unremarkable.  Suspect Viral Gastroenteritis.  Patient discharged home with Rx for Zofran.  Return precautions given.          Pascal Lux [place], PA-C 06/09/12 [phone]

## 2012-06-08 NOTE — ED Notes (Signed)
Onset today general abdominal pain 9/10 achy dull sharp with nausea emesis and diarrhea. Headache 6/10 throbbing.

## 2012-06-10 NOTE — ED Provider Notes (Signed)
Medical screening examination/treatment/procedure(s) were performed by non-physician practitioner and as supervising physician I was immediately available for consultation/collaboration.  Stephen Kohut, MD 06/10/12 0101 

## 2012-06-18 ENCOUNTER — Ambulatory Visit (INDEPENDENT_AMBULATORY_CARE_PROVIDER_SITE_OTHER): Payer: 59 | Admitting: Neurology

## 2012-06-18 ENCOUNTER — Encounter: Payer: Self-pay | Admitting: Neurology

## 2012-06-18 VITALS — BP 119/80 | HR 96 | Ht 69.5 in | Wt 288.0 lb

## 2012-06-18 DIAGNOSIS — G43909 Migraine, unspecified, not intractable, without status migrainosus: Secondary | ICD-10-CM | POA: Insufficient documentation

## 2012-06-18 DIAGNOSIS — G43009 Migraine without aura, not intractable, without status migrainosus: Secondary | ICD-10-CM

## 2012-06-18 HISTORY — DX: Migraine without aura, not intractable, without status migrainosus: G43.009

## 2012-06-18 MED ORDER — SUMATRIPTAN SUCCINATE 6 MG/0.5ML ~~LOC~~ SOLN: 6.0000 mg | Freq: Two times a day (BID) | SUBCUTANEOUS | Status: DC | PRN

## 2012-06-18 MED ORDER — SUMATRIPTAN SUCCINATE 100 MG PO TABS: 100.0000 mg | ORAL_TABLET | Freq: Two times a day (BID) | ORAL | Status: DC | PRN

## 2012-06-18 NOTE — Patient Instructions (Addendum)
   May use the Excedrine migraine with the imitrex.  Migraine Headache A migraine headache is an intense, throbbing pain on one or both sides of your head. A migraine can last for 30 minutes to several hours. CAUSES  The exact cause of a migraine headache is not always known. However, a migraine may be caused when nerves in the brain become irritated and release chemicals that cause inflammation. This causes pain. SYMPTOMS  Pain on one or both sides of your head.  Pulsating or throbbing pain.  Severe pain that prevents daily activities.  Pain that is aggravated by any physical activity.  Nausea, vomiting, or both.  Dizziness.  Pain with exposure to bright lights, loud noises, or activity.  General sensitivity to bright lights, loud noises, or smells. Before you get a migraine, you may get warning signs that a migraine is coming (aura). An aura may include:  Seeing flashing lights.  Seeing bright spots, halos, or zig-zag lines.  Having tunnel vision or blurred vision.  Having feelings of numbness or tingling.  Having trouble talking.  Having muscle weakness. MIGRAINE TRIGGERS  Alcohol.  Smoking.  Stress.  Menstruation.  Aged cheeses.  Foods or drinks that contain nitrates, glutamate, aspartame, or tyramine.  Lack of sleep.  Chocolate.  Caffeine.  Hunger.  Physical exertion.  Fatigue.  Medicines used to treat chest pain (nitroglycerine), birth control pills, estrogen, and some blood pressure medicines. DIAGNOSIS  A migraine headache is often diagnosed based on:  Symptoms.  Physical examination.  A CT scan or MRI of your head. TREATMENT Medicines may be given for pain and nausea. Medicines can also be given to help prevent recurrent migraines.  HOME CARE INSTRUCTIONS  Only take over-the-counter or prescription medicines for pain or discomfort as directed by your caregiver. The use of long-term narcotics is not recommended.  Lie down in a dark,  quiet room when you have a migraine.  Keep a journal to find out what may trigger your migraine headaches. For example, write down:  What you eat and drink.  How much sleep you get.  Any change to your diet or medicines.  Limit alcohol consumption.  Quit smoking if you smoke.  Get 7 to 9 hours of sleep, or as recommended by your caregiver.  Limit stress.  Keep lights dim if bright lights bother you and make your migraines worse. SEEK IMMEDIATE MEDICAL CARE IF:   Your migraine becomes severe.  You have a fever.  You have a stiff neck.  You have vision loss.  You have muscular weakness or loss of muscle control.  You start losing your balance or have trouble walking.  You feel faint or pass out.  You have severe symptoms that are different from your first symptoms. MAKE SURE YOU:   Understand these instructions.  Will watch your condition.  Will get help right away if you are not doing well or get worse. Document Released: 03/17/2005 Document Revised: 06/09/2011 Document Reviewed: 03/07/2011 [facility] Patient Information 2013 [place], [place].

## 2012-06-18 NOTE — Progress Notes (Signed)
Reason for visit: Headache  Michelle Thomas is an 37 y.o. female  History of present illness:  Michelle Thomas is a 37 year old right-handed black female with a history of obesity and a history of migraine headaches. The patient has had some problems with migraine over the last 2 years, but within the last 2 months, the headaches have become more frequent. The patient went from having on average one headache a month to having one headache every 2 weeks. The patient indicates that the headaches may last 1 or 2 days. The headaches tend to occur in the left frontal area, but occasionally they will occur on the right side. The headaches are a dull achy pain, and may occur coming out of sleep. The patient may have some nausea, no vomiting. The patient denies any visual disturbances, numbness or weakness of the face, arms, or legs. The patient indicates that stress is an activator, but she does not note any other things that bring on the headache. The patient may have some tenderness of the scalp with the headache, as well as photophobia and phonophobia. The patient in the past has gained some benefit with the Imitrex, and Toradol injections are very helpful. The patient is sent to this office for an evaluation.   ROS:  Out of a complete 14 system review of symptoms, the patient complains only of the following symptoms, and all other reviewed systems are negative.  Fatigue Dizziness Headache   Blood pressure 119/80, pulse 96, height 5' 9.5" (1.765 m), weight 288 lb (130.636 kg), last menstrual period 04/10/2012.  Physical Exam  General: The patient is alert and cooperative at the time of the examination. The patient is markedly obese.  Head: Pupils are equal, round, and reactive to light. Discs are flat bilaterally. The patient has good range of movement of the cervical spine. No crepitus is noted in the temporomandibular joints.  Neck: The neck is supple, no carotid bruits are  noted.  Respiratory: The respiratory examination is clear.  Cardiovascular: The cardiovascular examination reveals a regular rate and rhythm, no obvious murmurs or rubs are noted.  Skin: Extremities are without significant edema.  Neurologic Exam  Mental status:  Cranial nerves: Facial symmetry is present. There is good sensation of the face to pinprick and soft touch bilaterally. The strength of the facial muscles and the muscles to head turning and shoulder shrug are normal bilaterally. Speech is well enunciated, no aphasia or dysarthria is noted. Extraocular movements are full. Visual fields are full.  Motor: The motor testing reveals 5 over 5 strength of all 4 extremities. Good symmetric motor tone is noted throughout.  Sensory: Sensory testing is intact to pinprick, soft touch, vibration sensation, and position sense on all 4 extremities. No evidence of extinction is noted.  Coordination: Cerebellar testing reveals good finger-nose-finger and heel-to-shin bilaterally.  Gait and station: Gait is normal. Tandem gait is normal. Romberg is negative. No drift is seen  Reflexes: Deep tendon reflexes are symmetric, but are depressed bilaterally. Toes are downgoing bilaterally.   Assessment/Plan:  One. Migraine headache  2. Obesity  The patient will be given Imitrex tablets, and she will have Imitrex injections to take. Injections may be used initially, and she may take a tablet later in the day if the headache rebounded. The patient may also take her Excedrin Migraine. In the future, Toradol tablets may be used in place of this. If the headaches become more frequent, a daily prophylactic medication such as Topamax can be used. The  patient will followup through this office in 3 months.     Marlan Palau MD 06/18/2012 3:48 PM

## 2012-06-19 ENCOUNTER — Encounter: Payer: Self-pay | Admitting: Neurology

## 2012-09-16 ENCOUNTER — Ambulatory Visit: Payer: 59 | Admitting: Nurse Practitioner

## 2012-10-07 ENCOUNTER — Telehealth: Payer: Self-pay | Admitting: Nurse Practitioner

## 2013-01-02 ENCOUNTER — Encounter: Payer: Self-pay | Admitting: Cardiology

## 2013-02-10 DIAGNOSIS — R002 Palpitations: Secondary | ICD-10-CM

## 2013-02-21 ENCOUNTER — Telehealth: Payer: Self-pay | Admitting: General Surgery

## 2013-02-21 NOTE — Telephone Encounter (Signed)
Forwarded to pcp as requested. Waiting for pt to return call.

## 2013-02-21 NOTE — Telephone Encounter (Signed)
Michelle Thomas 02/10/2013 02:03:51 PM > please let patient know that heart monitor showed occasional extra heart beats from bottom of heart which are beingn - please forward to PCP who ordered the study

## 2013-02-22 ENCOUNTER — Encounter: Payer: Self-pay | Admitting: General Surgery

## 2013-02-22 NOTE — Telephone Encounter (Signed)
Letter sent to pt to make aware.  

## 2013-04-25 ENCOUNTER — Encounter (HOSPITAL_COMMUNITY): Payer: Self-pay | Admitting: Emergency Medicine

## 2013-04-25 ENCOUNTER — Emergency Department (HOSPITAL_COMMUNITY)
Admission: EM | Admit: 2013-04-25 | Discharge: 2013-04-25 | Disposition: A | Discharge status: Nursing Home (Includes ICF) | Payer: 59 | Source: Home / Self Care | Attending: Family Medicine | Admitting: Family Medicine

## 2013-04-25 ENCOUNTER — Emergency Department (HOSPITAL_COMMUNITY)
Admission: EM | Admit: 2013-04-25 | Discharge: 2013-04-25 | Disposition: A | Discharge status: Home / Self Care | Payer: 59 | Attending: Emergency Medicine | Admitting: Emergency Medicine

## 2013-04-25 ENCOUNTER — Emergency Department (HOSPITAL_COMMUNITY): Payer: 59

## 2013-04-25 DIAGNOSIS — R197 Diarrhea, unspecified: Secondary | ICD-10-CM

## 2013-04-25 DIAGNOSIS — N83202 Unspecified ovarian cyst, left side: Secondary | ICD-10-CM

## 2013-04-25 DIAGNOSIS — R109 Unspecified abdominal pain: Secondary | ICD-10-CM

## 2013-04-25 DIAGNOSIS — R112 Nausea with vomiting, unspecified: Secondary | ICD-10-CM

## 2013-04-25 DIAGNOSIS — G43009 Migraine without aura, not intractable, without status migrainosus: Secondary | ICD-10-CM | POA: Insufficient documentation

## 2013-04-25 DIAGNOSIS — R5381 Other malaise: Secondary | ICD-10-CM | POA: Insufficient documentation

## 2013-04-25 DIAGNOSIS — A088 Other specified intestinal infections: Secondary | ICD-10-CM | POA: Insufficient documentation

## 2013-04-25 DIAGNOSIS — R5383 Other fatigue: Secondary | ICD-10-CM

## 2013-04-25 DIAGNOSIS — A084 Viral intestinal infection, unspecified: Secondary | ICD-10-CM

## 2013-04-25 DIAGNOSIS — E669 Obesity, unspecified: Secondary | ICD-10-CM | POA: Insufficient documentation

## 2013-04-25 DIAGNOSIS — Z880 Allergy status to penicillin: Secondary | ICD-10-CM | POA: Insufficient documentation

## 2013-04-25 DIAGNOSIS — IMO0001 Reserved for inherently not codable concepts without codable children: Secondary | ICD-10-CM | POA: Insufficient documentation

## 2013-04-25 DIAGNOSIS — N83209 Unspecified ovarian cyst, unspecified side: Secondary | ICD-10-CM | POA: Insufficient documentation

## 2013-04-25 LAB — URINALYSIS W MICROSCOPIC + REFLEX CULTURE
Bilirubin Urine: NEGATIVE
Glucose, UA: NEGATIVE mg/dL
Hgb urine dipstick: NEGATIVE
Ketones, ur: 15 mg/dL — AB
NITRITE: NEGATIVE
PROTEIN: NEGATIVE mg/dL
SPECIFIC GRAVITY, URINE: 1.021 (ref 1.005–1.030)
UROBILINOGEN UA: 0.2 mg/dL (ref 0.0–1.0)
pH: 5.5 (ref 5.0–8.0)

## 2013-04-25 LAB — CBC WITH DIFFERENTIAL/PLATELET
BASOS ABS: 0 10*3/uL (ref 0.0–0.1)
Basophils Relative: 0 % (ref 0–1)
EOS ABS: 0 10*3/uL (ref 0.0–0.7)
EOS PCT: 0 % (ref 0–5)
HCT: 38.3 % (ref 36.0–46.0)
Hemoglobin: 12.4 g/dL (ref 12.0–15.0)
LYMPHS ABS: 0.4 10*3/uL — AB (ref 0.7–4.0)
Lymphocytes Relative: 4 % — ABNORMAL LOW (ref 12–46)
MCH: 24 pg — AB (ref 26.0–34.0)
MCHC: 32.4 g/dL (ref 30.0–36.0)
MCV: 74.1 fL — AB (ref 78.0–100.0)
Monocytes Absolute: 0.3 10*3/uL (ref 0.1–1.0)
Monocytes Relative: 3 % (ref 3–12)
NEUTROS PCT: 92 % — AB (ref 43–77)
Neutro Abs: 9.4 10*3/uL — ABNORMAL HIGH (ref 1.7–7.7)
PLATELETS: 207 10*3/uL (ref 150–400)
RBC: 5.17 MIL/uL — AB (ref 3.87–5.11)
RDW: 15.1 % (ref 11.5–15.5)
WBC: 10.2 10*3/uL (ref 4.0–10.5)

## 2013-04-25 LAB — POCT I-STAT, CHEM 8
BUN: 15 mg/dL (ref 6–23)
CREATININE: 1 mg/dL (ref 0.50–1.10)
Calcium, Ion: 1.22 mmol/L (ref 1.12–1.23)
Chloride: 103 mEq/L (ref 96–112)
Glucose, Bld: 99 mg/dL (ref 70–99)
HEMATOCRIT: 45 % (ref 36.0–46.0)
HEMOGLOBIN: 15.3 g/dL — AB (ref 12.0–15.0)
POTASSIUM: 4.4 meq/L (ref 3.7–5.3)
SODIUM: 139 meq/L (ref 137–147)
TCO2: 27 mmol/L (ref 0–100)

## 2013-04-25 MED ORDER — IOHEXOL 300 MG/ML  SOLN
25.0000 mL | INTRAMUSCULAR | Status: AC
  Administered 2013-04-25: 25 mL via ORAL

## 2013-04-25 MED ORDER — SODIUM CHLORIDE 0.9 % IV BOLUS (SEPSIS)
1000.0000 mL | Freq: Once | INTRAVENOUS | Status: AC
  Administered 2013-04-25: 1000 mL via INTRAVENOUS

## 2013-04-25 MED ORDER — ONDANSETRON HCL 4 MG/2ML IJ SOLN
INTRAMUSCULAR | Status: AC
  Filled 2013-04-25: qty 2

## 2013-04-25 MED ORDER — BUTALBITAL-APAP-CAFFEINE 50-325-40 MG PO TABS: 1.0000 | ORAL_TABLET | Freq: Four times a day (QID) | ORAL | Status: DC | PRN

## 2013-04-25 MED ORDER — MORPHINE SULFATE 4 MG/ML IJ SOLN
4.0000 mg | Freq: Once | INTRAMUSCULAR | Status: AC
  Administered 2013-04-25: 4 mg via INTRAVENOUS
  Filled 2013-04-25: qty 1

## 2013-04-25 MED ORDER — FENTANYL CITRATE 0.05 MG/ML IJ SOLN
25.0000 ug | Freq: Once | INTRAMUSCULAR | Status: AC
  Administered 2013-04-25: 25 ug via INTRAVENOUS
  Filled 2013-04-25: qty 2

## 2013-04-25 MED ORDER — ACETAMINOPHEN 325 MG PO TABS
650.0000 mg | ORAL_TABLET | Freq: Once | ORAL | Status: AC
  Administered 2013-04-25: 650 mg via ORAL
  Filled 2013-04-25: qty 2

## 2013-04-25 MED ORDER — ONDANSETRON HCL 4 MG/2ML IJ SOLN
4.0000 mg | Freq: Once | INTRAMUSCULAR | Status: AC
  Administered 2013-04-25: 4 mg via INTRAVENOUS

## 2013-04-25 MED ORDER — ONDANSETRON HCL 4 MG/2ML IJ SOLN
4.0000 mg | Freq: Once | INTRAMUSCULAR | Status: AC
  Administered 2013-04-25: 4 mg via INTRAVENOUS
  Filled 2013-04-25: qty 2

## 2013-04-25 MED ORDER — IOHEXOL 300 MG/ML  SOLN
100.0000 mL | Freq: Once | INTRAMUSCULAR | Status: AC | PRN
  Administered 2013-04-25: 100 mL via INTRAVENOUS

## 2013-04-25 MED ORDER — ONDANSETRON 8 MG PO TBDP: ORAL_TABLET | ORAL | Status: DC

## 2013-04-25 NOTE — ED Provider Notes (Signed)
CSN: [id number]     Arrival date & time 04/25/13  1430 History   First MD Initiated Contact with Patient 04/25/13 1500     Chief Complaint  Patient presents with  . Emesis  . Diarrhea   (Consider location/radiation/quality/duration/timing/severity/associated sxs/prior Treatment) HPI Comments: 38 year old female presents complaining of nausea, vomiting, diarrhea, abdominal pain, subjective fever. She was awoken from her sleep by the abdominal pain at 4:30 this morning. Since then, she has had constant diarrhea and vomiting, as well as constant abdominal pain. She is not able to hold down anything today. 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 Her temperature has not been measured. The abdominal pain started before the vomiting or diarrhea began. No blood in the diarrhea or vomitus. No recent travel or sick contacts.  Patient is a 38 y.o. female presenting with vomiting and diarrhea.  Emesis Associated symptoms: abdominal pain and diarrhea   Associated symptoms: no arthralgias, no chills and no myalgias   Diarrhea Associated symptoms: abdominal pain and vomiting   Associated symptoms: no arthralgias, no chills, no fever and no myalgias     Past Medical History  Diagnosis Date  . Migraine without aura, without mention of intractable migraine without mention of status migrainosus 06/18/2012  . Obesity    Past Surgical History  Procedure Laterality Date  . Wisdom tooth extraction     Family History  Problem Relation Age of Onset  . Hypertension Father   . Diabetes Father   . Cancer Father     colon cancer  . Headache Sister    History  Substance Use Topics  . Smoking status: Never Smoker   . Smokeless tobacco: Not on file  . Alcohol Use: Yes     Comment: occasional   OB History   Grav Para Term Preterm Abortions TAB SAB Ect Mult Living                 Review of Systems  Constitutional: Negative for fever and chills.  Eyes: Negative for  visual disturbance.  Respiratory: Negative for cough and shortness of breath.   Cardiovascular: Negative for chest pain, palpitations and leg swelling.  Gastrointestinal: Positive for nausea, vomiting, abdominal pain and diarrhea.  Endocrine: Negative for polydipsia and polyuria.  Genitourinary: Negative for dysuria, urgency and frequency.  Musculoskeletal: Negative for arthralgias and myalgias.  Skin: Negative for rash.  Neurological: Negative for dizziness, weakness and light-headedness.    Allergies  Codeine and Penicillins cross reactors  Home Medications   Current Outpatient Rx  Name  Route  Sig  Dispense  Refill  . aspirin-acetaminophen-caffeine (EXCEDRIN MIGRAINE) 250-250-65 MG per tablet   Oral   Take 1 tablet by mouth every 6 (six) hours as needed for pain.         [name] ondansetron (ZOFRAN ODT) 8 MG disintegrating tablet   Oral   Take 1 tablet (8 mg total) by mouth every 8 (eight) hours as needed for nausea.   20 tablet   0   . SUMAtriptan (IMITREX STATDOSE REFILL) 6 MG/0.5ML SOLN injection   Subcutaneous   Inject 0.5 mLs (6 mg total) into the skin 2 (two) times daily as needed for migraine or headache (No more than two doses in a 24 hour period). F   4 vial   3   . SUMAtriptan (IMITREX) 100 MG tablet   Oral   Take 1 tablet (100 mg total) by mouth 2 (two) times daily as needed for  migraine (No more than 2 doses in a 24 hour period).   10 tablet   2    BP 128/80  Pulse 130  Temp(Src) 99.1 F (37.3 C) (Oral)  Resp 20  SpO2 99%  LMP 03/25/2013 Physical Exam  Nursing note and vitals reviewed. Constitutional: She is oriented to person, place, and time. Vital signs are normal. She appears well-developed and well-nourished. No distress.  HENT:  Head: Normocephalic and atraumatic.  Cardiovascular: Regular rhythm and normal heart sounds.  Tachycardia present.  Exam reveals no gallop and no friction rub.   No murmur heard. Pulmonary/Chest: Effort normal and breath  sounds normal. No respiratory distress. She has no wheezes. She has no rales.  Abdominal: Soft. Normal appearance. There is tenderness (worse in the RLQ) in the right upper quadrant and right lower quadrant. There is tenderness at McBurney's point. There is no rigidity, no rebound, no guarding, no CVA tenderness and negative Murphy's sign.  Neurological: She is alert and oriented to person, place, and time. She has normal strength. Coordination normal.  Skin: Skin is warm and dry. No rash noted. She is not diaphoretic.  Psychiatric: She has a normal mood and affect. Judgment normal.    ED Course  Procedures (including critical care time) Labs Review Labs Reviewed  CBC WITH DIFFERENTIAL - Abnormal; Notable for the following:    RBC 5.17 (*)    MCV 74.1 (*)    MCH 24.0 (*)    Neutrophils Relative % 92 (*)    Neutro Abs 9.4 (*)    Lymphocytes Relative 4 (*)    Lymphs Abs 0.4 (*)    All other components within normal limits  POCT I-STAT, CHEM 8 - Abnormal; Notable for the following:    Hemoglobin 15.3 (*)    All other components within normal limits   Imaging Review No results found.  1510: Giving 1 L of normal saline, 4 mg IV Zofran, sending CBC and i-STAT. 1613: After Zofran and most of a 1 L bolus, the nausea has resolved but she has continued abdominal pain. MDM   1. Nausea & vomiting   2. Abdominal pain   3. Diarrhea    I have explained this patient that she probably has viral gastroenteritis but we cannot rule out early appendicitis. I have recommended waiting to see if this resolves, treating for gastroenteritis, and going to the emergency department if she gets worse but have offered to send her there now if she would like, she elects to go to the emergency department at this time to rule out appendicitis. Transferred via shuttle   Meds ordered this encounter  Medications  . sodium chloride 0.9 % bolus 1,000 mL    Sig:   . ondansetron (ZOFRAN) injection 4 mg    Sig:         Liam Graham, PA-C 04/25/13 1615

## 2013-04-25 NOTE — Discharge Instructions (Signed)
Drink plenty of fluids to stay hydrated. Rest help your body recover from the infection. Wash hands frequently to help reduce spread of infection to others. Followup with your primary care provider for continued evaluation and treatment as well as evaluation of your ovarian cyst.    Viral Gastroenteritis Viral gastroenteritis is also known as stomach flu. This condition affects the stomach and intestinal tract. It can cause sudden diarrhea and vomiting. The illness typically lasts 3 to 8 days. Most people develop an immune response that eventually gets rid of the virus. While this natural response develops, the virus can make you quite ill. CAUSES  Many different viruses can cause gastroenteritis, such as rotavirus or noroviruses. You can catch one of these viruses by consuming contaminated food or water. You may also catch a virus by sharing utensils or other personal items with an infected person or by touching a contaminated surface. SYMPTOMS  The most common symptoms are diarrhea and vomiting. These problems can cause a severe loss of body fluids (dehydration) and a body salt (electrolyte) imbalance. Other symptoms may include:  Fever.  Headache.  Fatigue.  Abdominal pain. DIAGNOSIS  Your caregiver can usually diagnose viral gastroenteritis based on your symptoms and a physical exam. A stool sample may also be taken to test for the presence of viruses or other infections. TREATMENT  This illness typically goes away on its own. Treatments are aimed at rehydration. The most serious cases of viral gastroenteritis involve vomiting so severely that you are not able to keep fluids down. In these cases, fluids must be given through an intravenous line (IV). HOME CARE INSTRUCTIONS   Drink enough fluids to keep your urine clear or pale yellow. Drink small amounts of fluids frequently and increase the amounts as tolerated.  Ask your caregiver for specific rehydration  instructions.  Avoid:  Foods high in sugar.  Alcohol.  Carbonated drinks.  Tobacco.  Juice.  Caffeine drinks.  Extremely hot or cold fluids.  Fatty, greasy foods.  Too much intake of anything at one time.  Dairy products until 24 to 48 hours after diarrhea stops.  You may consume probiotics. Probiotics are active cultures of beneficial bacteria. They may lessen the amount and number of diarrheal stools in adults. Probiotics can be found in yogurt with active cultures and in supplements.  Wash your hands well to avoid spreading the virus.  Only take over-the-counter or prescription medicines for pain, discomfort, or fever as directed by your caregiver. Do not give aspirin to children. Antidiarrheal medicines are not recommended.  Ask your caregiver if you should continue to take your regular prescribed and over-the-counter medicines.  Keep all follow-up appointments as directed by your caregiver. SEEK IMMEDIATE MEDICAL CARE IF:   You are unable to keep fluids down.  You do not urinate at least once every 6 to 8 hours.  You develop shortness of breath.  You notice blood in your stool or vomit. This may look like coffee grounds.  You have abdominal pain that increases or is concentrated in one small area (localized).  You have persistent vomiting or diarrhea.  You have a fever.  The patient is a child younger than 3 months, and he or she has a fever.  The patient is a child older than 3 months, and he or she has a fever and persistent symptoms.  The patient is a child older than 3 months, and he or she has a fever and symptoms suddenly get worse.  The patient is  a baby, and he or she has no tears when crying. MAKE SURE YOU:   Understand these instructions.  Will watch your condition.  Will get help right away if you are not doing well or get worse. Document Released: 03/17/2005 Document Revised: 06/09/2011 Document Reviewed: 01/01/2011 [facility] Patient  Information 2014 [place].

## 2013-04-25 NOTE — ED Provider Notes (Signed)
CSN: [id number]     Arrival date & time 04/25/13  1646 History   First MD Initiated Contact with Patient 04/25/13 1815     Chief Complaint  Patient presents with  . Emesis  . Abdominal Pain   (Consider location/radiation/quality/duration/timing/severity/associated sxs/prior Treatment) HPI Michelle Thomas is a 38 y.o. female who presents emergency department complaining of abdominal pain, nausea, vomiting, diarrhea. She states she woke up with symptoms around 4:30 this morning. States had over 10 episodes of vomiting and diarrhea. States that she's unable to keep any fluids or solids down. She's not tried to take any medications because did not think she would keep it down. She states she had felt warm and had chills, did not measure her temperature. She reports severe pain in the right lower quadrant of her abdomen. She denies any blood in her emesis or in his urine. 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 She denies any recent travel.  Past Medical History  Diagnosis Date  . Migraine without aura, without mention of intractable migraine without mention of status migrainosus 06/18/2012  . Obesity    Past Surgical History  Procedure Laterality Date  . Wisdom tooth extraction     Family History  Problem Relation Age of Onset  . Hypertension Father   . Diabetes Father   . Cancer Father     colon cancer  . Headache Sister    History  Substance Use Topics  . Smoking status: Never Smoker   . Smokeless tobacco: Not on file  . Alcohol Use: Yes     Comment: occasional   OB History   Grav Para Term Preterm Abortions TAB SAB Ect Mult Living                 Review of Systems  Constitutional: Positive for fever, chills and fatigue.  Respiratory: Negative for cough, chest tightness and shortness of breath.   Cardiovascular: Negative for chest pain, palpitations and leg swelling.  Gastrointestinal: Positive for nausea, vomiting,  abdominal pain and diarrhea. Negative for constipation and blood in stool.  Genitourinary: Negative for dysuria, flank pain, vaginal bleeding, vaginal discharge, vaginal pain and pelvic pain.  Musculoskeletal: Positive for myalgias. Negative for arthralgias.  Skin: Negative for rash.  Neurological: Positive for weakness. Negative for dizziness and headaches.  All other systems reviewed and are negative.    Allergies  Codeine and Penicillins cross reactors  Home Medications   Current Outpatient Rx  Name  Route  Sig  Dispense  Refill  . aspirin-acetaminophen-caffeine (EXCEDRIN MIGRAINE) 250-250-65 MG per tablet   Oral   Take 1 tablet by mouth every 6 (six) hours as needed for pain.         . SUMAtriptan (IMITREX STATDOSE REFILL) 6 MG/0.5ML SOLN injection   Subcutaneous   Inject 0.5 mLs (6 mg total) into the skin 2 (two) times daily as needed for migraine or headache (No more than two doses in a 24 hour period). F   4 vial   3   . SUMAtriptan (IMITREX) 100 MG tablet   Oral   Take 1 tablet (100 mg total) by mouth 2 (two) times daily as needed for migraine (No more than 2 doses in a 24 hour period).   10 tablet   2    BP 126/70  Pulse 104  Temp(Src) 99.5 F (37.5 C) (Oral)  Resp 18  SpO2 96%  LMP 03/25/2013 Physical Exam  Nursing note  and vitals reviewed. Constitutional: She appears well-developed and well-nourished. No distress.  HENT:  Head: Normocephalic.  Eyes: Conjunctivae are normal.  Neck: Neck supple.  Cardiovascular: Normal rate, regular rhythm and normal heart sounds.   Pulmonary/Chest: Effort normal and breath sounds normal. No respiratory distress. She has no wheezes. She has no rales.  Abdominal: Soft. Bowel sounds are normal. She exhibits no distension. There is tenderness. There is no rebound and no guarding.  Right lower quadrant tenderness. No CVA tenderness.  Musculoskeletal: She exhibits no edema.  Neurological: She is alert.  Skin: Skin is warm  and dry.  Psychiatric: She has a normal mood and affect. Her behavior is normal.    ED Course  Procedures (including critical care time) Labs Review Labs Reviewed  URINALYSIS W MICROSCOPIC + REFLEX CULTURE - Abnormal; Notable for the following:    Ketones, ur 15 (*)    Leukocytes, UA SMALL (*)    Bacteria, UA FEW (*)    Squamous Epithelial / LPF FEW (*)    All other components within normal limits   Imaging Review No results found.  EKG Interpretation   None       MDM  No diagnosis found.  Patient with nausea, vomiting, diarrhea. Multiple episodes during the day today. Patient is tachycardic, temperature 99.5. Blood pressure normal. We'll start IV fluids, antiemetics, pain medication. Most likely viral infection based on the symptoms however given patient's severe right lower quadrant pain and tenderness on exam we'll get a CT scan to rule out appendicitis. Patient deferred pelvic exam, states that she has not had any intercourse recently.  pts labs were drawn at urgent care. WBC normal.   Pt signed out to PA  Dammen at shift change pending CT  Filed Vitals:   04/25/13 1654 04/25/13 1827 04/25/13 1934  BP: 131/82 126/70 139/67  Pulse: 92 104 106  Temp: 99 F (37.2 C) 99.5 F (37.5 C)   TempSrc: Oral Oral   Resp: 18 18   SpO2: 96% 96% 96%     Renold Genta, PA-C 04/25/13 2016

## 2013-04-25 NOTE — ED Notes (Signed)
Pt sent from [facility] to r/o appendicitis. RLQ pain, nausea/vomiting/diarrhea since 430 this am. Pain 8/10.

## 2013-04-25 NOTE — ED Provider Notes (Signed)
Michelle Thomas S 8:00 PM patient discussed in sign out. Patient with symptoms of acute nausea, vomiting and watery diarrhea. Was seen in urgent care Center with concerns for abdominal pains and tenderness in the right lower quadrant. CT scan ordered to rule out appendicitis. Patient had unremarkable CBC and lab testing in urgent care. Patient also tachycardic at triage. She is receiving IV fluids.   9:45 PM CT scan results demonstrate normal appendix. Small left ovarian cyst. No other concerning findings. Patient is resting comfortably he does feel significantly improved. Heart rate is normal at 96 while at rest. She is tolerating by mouth fluids. At this time she may be discharged home with symptomatic treatment. I did discuss CT findings including ovarian cyst with patient.   Martie Lee, PA-C 04/25/13 2155

## 2013-04-25 NOTE — ED Provider Notes (Signed)
Medical screening examination/treatment/procedure(s) were conducted as a shared visit with non-physician practitioner(s) and myself.  I personally evaluated the patient during the encounter.   Please see my separate note.     Houston Siren, MD 04/25/13 [phone]

## 2013-04-25 NOTE — ED Provider Notes (Signed)
Medical screening examination/treatment/procedure(s) were performed by resident physician or non-physician practitioner and as supervising physician I was immediately available for consultation/collaboration.   Pauline Good MD.   Billy Fischer, MD 04/25/13 2108

## 2013-04-25 NOTE — ED Notes (Signed)
Spoke with Darryl in minilab about POCT pregnancy results which were negative. CT informed.

## 2013-04-25 NOTE — ED Notes (Signed)
C/o of n/v/d with low grade fever since 4:30 a.m.   Denies any other symptoms.  No otc meds taken for symptoms.  Denies any changes in diet.

## 2013-04-25 NOTE — ED Provider Notes (Signed)
Medical screening examination/treatment/procedure(s) were conducted as a shared visit with non-physician practitioner(s) and myself.  I personally evaluated the patient during the encounter.  EKG Interpretation   None       38 yo female with vomiting, diarrhea, and RLQ abdominal pain.  On exam, well appearing, nontoxic, abdomen soft, tender in RLQ, less tender in LLQ and RUQ, no R/R/G.  Pain not worse with eating, no vaginal discharge/bleeding.  CT negative for acute process.  Doubt TOA, ov torsion, or cholecystitis.  Plan outpatient treatment.  Given return precautions.   Clinical Impression: 1. Viral gastroenteritis   2. Nausea vomiting and diarrhea   3. Abdominal pain   4. Ovarian cyst, left        Houston Siren, MD 04/25/13 2340

## 2013-04-26 LAB — URINE CULTURE
Colony Count: NO GROWTH
Culture: NO GROWTH

## 2013-06-08 NOTE — Telephone Encounter (Signed)
Pt was a no show closing encounter °

## 2013-07-26 ENCOUNTER — Emergency Department (HOSPITAL_BASED_OUTPATIENT_CLINIC_OR_DEPARTMENT_OTHER)
Admission: EM | Admit: 2013-07-26 | Discharge: 2013-07-26 | Disposition: A | Discharge status: Home / Self Care | Payer: 59 | Attending: Emergency Medicine | Admitting: Emergency Medicine

## 2013-07-26 ENCOUNTER — Ambulatory Visit (INDEPENDENT_AMBULATORY_CARE_PROVIDER_SITE_OTHER): Payer: 59 | Admitting: Emergency Medicine

## 2013-07-26 ENCOUNTER — Encounter (HOSPITAL_BASED_OUTPATIENT_CLINIC_OR_DEPARTMENT_OTHER): Payer: Self-pay | Admitting: Emergency Medicine

## 2013-07-26 VITALS — BP 122/84 | HR 90 | Temp 98.4°F | Resp 16 | Ht 68.25 in | Wt 284.4 lb

## 2013-07-26 DIAGNOSIS — E669 Obesity, unspecified: Secondary | ICD-10-CM | POA: Insufficient documentation

## 2013-07-26 DIAGNOSIS — G43909 Migraine, unspecified, not intractable, without status migrainosus: Secondary | ICD-10-CM | POA: Insufficient documentation

## 2013-07-26 DIAGNOSIS — Z880 Allergy status to penicillin: Secondary | ICD-10-CM | POA: Insufficient documentation

## 2013-07-26 DIAGNOSIS — M542 Cervicalgia: Secondary | ICD-10-CM | POA: Insufficient documentation

## 2013-07-26 DIAGNOSIS — R519 Headache, unspecified: Secondary | ICD-10-CM

## 2013-07-26 DIAGNOSIS — R51 Headache: Secondary | ICD-10-CM

## 2013-07-26 MED ORDER — DIPHENHYDRAMINE HCL 50 MG/ML IJ SOLN
12.5000 mg | Freq: Once | INTRAMUSCULAR | Status: AC
  Administered 2013-07-26: 12.5 mg via INTRAVENOUS
  Filled 2013-07-26: qty 1

## 2013-07-26 MED ORDER — SODIUM CHLORIDE 0.9 % IV BOLUS (SEPSIS)
1000.0000 mL | Freq: Once | INTRAVENOUS | Status: AC
  Administered 2013-07-26: 1000 mL via INTRAVENOUS

## 2013-07-26 MED ORDER — BUTALBITAL-APAP-CAFFEINE 50-325-40 MG PO TABS: 1.0000 | ORAL_TABLET | Freq: Four times a day (QID) | ORAL | Status: DC | PRN

## 2013-07-26 MED ORDER — KETOROLAC TROMETHAMINE 30 MG/ML IJ SOLN
30.0000 mg | Freq: Once | INTRAMUSCULAR | Status: AC
  Administered 2013-07-26: 30 mg via INTRAVENOUS
  Filled 2013-07-26: qty 1

## 2013-07-26 MED ORDER — METOCLOPRAMIDE HCL 5 MG/ML IJ SOLN
10.0000 mg | Freq: Once | INTRAMUSCULAR | Status: AC
  Administered 2013-07-26: 10 mg via INTRAVENOUS
  Filled 2013-07-26: qty 2

## 2013-07-26 NOTE — ED Notes (Signed)
Headache x 5 days. Light sensitive and nauseated.

## 2013-07-26 NOTE — ED Notes (Signed)
C/o frontal headache x 5 days, sensitive to light and sound, + vomiting today, seen at urgent care yesterday, started on tramadol. Pt states she was given a shot at urgent care yesterday, took 1 tramadol headache went away, when patient awoke this am her headache was back, she took another tramadol, went back to sleep and when she awoke this afternoon her migraine was still there and she states she started vomiting, at that point she went back to urgent care and md there referred her here for ct scan

## 2013-07-26 NOTE — Addendum Note (Signed)
Addended by: Frutoso Chase A on: 07/26/2013 07:39 PM   Modules accepted: Level of Service

## 2013-07-26 NOTE — Progress Notes (Signed)
[facility] [address], [place] [address] [phone]  Date:  07/26/2013   Name:  Michelle Thomas   DOB:  [birth year]   MRN:  [id number]  PCP:  Vena Austria, MD    Chief Complaint: Headache   History of Present Illness:  Michelle Thomas is a 38 y.o. very pleasant female patient who presents with the following:  Five day continuous headache located in right temporal area.  Does not throb or pulsate but is persistent and severe.  Beginning to be associated with photophobia.  Nauseated with some vomiting.  No fever or chills, antecedent illness of injury.  This is different from her migraines in that she has never had a migraine persist and they are controlled with OTC medication.  She went to [facility] last night and was given tramadol with only passing relief.  Some blurred vision in right eye.  No other neuro symptoms.    Patient Active Problem List   Diagnosis Date Noted  . Migraine without aura, without mention of intractable migraine without mention of status migrainosus 06/18/2012    Past Medical History  Diagnosis Date  . Migraine without aura, without mention of intractable migraine without mention of status migrainosus 06/18/2012  . Obesity     Past Surgical History  Procedure Laterality Date  . Wisdom tooth extraction      History  Substance Use Topics  . Smoking status: Never Smoker   . Smokeless tobacco: Not on file  . Alcohol Use: Yes     Comment: 1 weekly    Family History  Problem Relation Age of Onset  . Hypertension Father   . Diabetes Father   . Cancer Father     colon cancer  . Headache Sister     Allergies  Allergen Reactions  . Codeine     Rash  . Penicillins Cross Reactors Hives    Medication list has been reviewed and updated.  Current Outpatient Prescriptions on File Prior to Visit  Medication Sig Dispense Refill  . aspirin-acetaminophen-caffeine (EXCEDRIN MIGRAINE) 250-250-65 MG  per tablet Take 1 tablet by mouth every 6 (six) hours as needed for pain.       No current facility-administered medications on file prior to visit.    Review of Systems:  As per HPI, otherwise negative.    Physical Examination: Filed Vitals:   07/26/13 1838  BP: 122/84  Pulse: 90  Temp: 98.4 F (36.9 C)  Resp: 16   Filed Vitals:   07/26/13 1838  Height: 5' 8.25" (1.734 m)  Weight: 284 lb 6.4 oz (129.003 kg)   Body mass index is 42.9 kg/(m^2). Ideal Body Weight: Weight in (lb) to have BMI = 25: 165.3  GEN: obese, NAD, Non-toxic, A & O x 3 HEENT: Atraumatic, Normocephalic. Neck supple. No masses, No LAD.  PRRERLA EOMI CN 2-12 intact Ears and Nose: No external deformity. CV: RRR, No M/G/R. No JVD. No thrill. No extra heart sounds. PULM: CTA B, no wheezes, crackles, rhonchi. No retractions. No resp. distress. No accessory muscle use. ABD: S, NT, ND, +BS. No rebound. No HSM. EXTR: No c/c/e  Romberg and tandem gait normal NEURO Normal gait.  PSYCH: Normally interactive. Conversant. Not depressed or anxious appearing.  Calm demeanor.    Assessment and Plan: Atypical headache ER for CT scan  Signed,  Ellison Carwin, MD

## 2013-07-26 NOTE — ED Provider Notes (Signed)
TIME SEEN: 9:43 PM  CHIEF COMPLAINT: migraine  HPI: HPI Comments: Michelle Thomas is a 38 y.o. female with history of migraines who presents to the Emergency Department complaining of migraine with associated sensitivity to light and sound ongoing for five days. Patient reports being seen at [facility] last night and given a steroid shot and prescription for Tramadol. Despite taking the medication as directed patient reports only mild relief. Tonight patient began vomiting. She states that this migraine is uncharacteristic of previous episodes in that it has lasted for so long and she is experiencing some right sided neck pain. No midline neck pain or neck stiffness.Patient denies recent head injury. Patient denies fever, chills, diarrhea, numbness or focal weakness. She is not on anticoagulation.  ROS: See HPI Constitutional: no fever  Eyes: no drainage  ENT: no runny nose   Cardiovascular:  no chest pain  Resp: no SOB  GI: no vomiting GU: no dysuria Integumentary: no rash  Allergy: no hives  Musculoskeletal: no leg swelling  Neurological: no slurred speech ROS otherwise negative  PAST MEDICAL HISTORY/PAST SURGICAL HISTORY:  Past Medical History  Diagnosis Date  . Migraine without aura, without mention of intractable migraine without mention of status migrainosus 06/18/2012  . Obesity     MEDICATIONS:  Prior to Admission medications   Medication Sig Start Date End Date Taking? Authorizing Provider  aspirin-acetaminophen-caffeine (EXCEDRIN MIGRAINE) [phone] MG per tablet Take 1 tablet by mouth every 6 (six) hours as needed for pain.    Historical Provider, MD  promethazine (PHENERGAN) 25 MG tablet Take 25 mg by mouth every 8 (eight) hours as needed for nausea or vomiting.    Historical Provider, MD  traMADol (ULTRAM) 50 MG tablet Take 50 mg by mouth at bedtime.    Historical Provider, MD    ALLERGIES:  Allergies  Allergen Reactions  . Codeine     Rash  . Penicillins Cross  Reactors Hives    SOCIAL HISTORY:  History  Substance Use Topics  . Smoking status: Never Smoker   . Smokeless tobacco: Not on file  . Alcohol Use: Yes     Comment: 1 weekly    FAMILY HISTORY: Family History  Problem Relation Age of Onset  . Hypertension Father   . Diabetes Father   . Cancer Father     colon cancer  . Headache Sister     EXAM: BP 130/87  Pulse 87  Temp(Src) 98.1 F (36.7 C) (Oral)  Resp 20  Ht 5\' 10"  (1.778 m)  Wt 284 lb (128.822 kg)  BMI 40.75 kg/m2  SpO2 100%  LMP 05/29/2013 CONSTITUTIONAL: Alert and oriented and responds appropriately to questions. Well-appearing; well-nourished HEAD: Normocephalic EYES: Conjunctivae clear, PERRL ENT: normal nose; no rhinorrhea; moist mucous membranes; pharynx without lesions noted NECK: Supple, no meningismus, no LAD  CARD: RRR; S1 and S2 appreciated; no murmurs, no clicks, no rubs, no gallops RESP: Normal chest excursion without splinting or tachypnea; breath sounds clear and equal bilaterally; no wheezes, no rhonchi, no rales,  ABD/GI: Normal bowel sounds; non-distended; soft, non-tender, no rebound, no guarding BACK:  The back appears normal and is non-tender to palpation, there is no CVA tenderness EXT: Normal ROM in all joints; non-tender to palpation; no edema; normal capillary refill; no cyanosis    SKIN: Normal color for age and race; warm NEURO: Moves all extremities equally 5/5 strength in all extremities. Senation of light touch intact diffusely. Cranial nerves 2-12 are intact. No dysmetria of finger to nose  test bilaterally. Normal gait. PSYCH: The patient's mood and manner are appropriate. Grooming and personal hygiene are appropriate.  MEDICAL DECISION MAKING: Patient here with migraine headache. She is neurologically intact, well-appearing, nontoxic with no meningismus. She is hemodynamically stable. We'll give IV fluids, Toradol, Reglan and Benadryl. I do not feel she needs head imaging at this  time I have discussed this with patient and her family. I am not concerned for intracranial hemorrhage, meningitis or encephalitis, infarct. I am not concerned for cavernous sinus thrombosis. I feel this is likely her migraine headache she has had migraines in the past.  ED PROGRESS: Patient reports her headache is completely resolved with IV medications. She is still neurologically intact. We'll discharge home with prescription for Fioricet she may take as needed for her migraines. Discussed return precautions. Patient and family at bedside verbalize understanding and are comfortable with this plan.     [place], DO 07/27/13 0021

## 2013-07-26 NOTE — Patient Instructions (Signed)
Migraine Headache A migraine headache is an intense, throbbing pain on one or both sides of your head. A migraine can last for 30 minutes to several hours. CAUSES  The exact cause of a migraine headache is not always known. However, a migraine may be caused when nerves in the brain become irritated and release chemicals that cause inflammation. This causes pain. Certain things may also trigger migraines, such as:  Alcohol.  Smoking.  Stress.  Menstruation.  Aged cheeses.  Foods or drinks that contain nitrates, glutamate, aspartame, or tyramine.  Lack of sleep.  Chocolate.  Caffeine.  Hunger.  Physical exertion.  Fatigue.  Medicines used to treat chest pain (nitroglycerine), birth control pills, estrogen, and some blood pressure medicines. SIGNS AND SYMPTOMS  Pain on one or both sides of your head.  Pulsating or throbbing pain.  Severe pain that prevents daily activities.  Pain that is aggravated by any physical activity.  Nausea, vomiting, or both.  Dizziness.  Pain with exposure to bright lights, loud noises, or activity.  General sensitivity to bright lights, loud noises, or smells. Before you get a migraine, you may get warning signs that a migraine is coming (aura). An aura may include:  Seeing flashing lights.  Seeing bright spots, halos, or zig-zag lines.  Having tunnel vision or blurred vision.  Having feelings of numbness or tingling.  Having trouble talking.  Having muscle weakness. DIAGNOSIS  A migraine headache is often diagnosed based on:  Symptoms.  Physical exam.  A CT scan or MRI of your head. These imaging tests cannot diagnose migraines, but they can help rule out other causes of headaches. TREATMENT Medicines may be given for pain and nausea. Medicines can also be given to help prevent recurrent migraines.  HOME CARE INSTRUCTIONS  Only take over-the-counter or prescription medicines for pain or discomfort as directed by your  health care provider. The use of long-term narcotics is not recommended.  Lie down in a dark, quiet room when you have a migraine.  Keep a journal to find out what may trigger your migraine headaches. For example, write down:  What you eat and drink.  How much sleep you get.  Any change to your diet or medicines.  Limit alcohol consumption.  Quit smoking if you smoke.  Get 7 9 hours of sleep, or as recommended by your health care provider.  Limit stress.  Keep lights dim if bright lights bother you and make your migraines worse. SEEK IMMEDIATE MEDICAL CARE IF:   Your migraine becomes severe.  You have a fever.  You have a stiff neck.  You have vision loss.  You have muscular weakness or loss of muscle control.  You start losing your balance or have trouble walking.  You feel faint or pass out.  You have severe symptoms that are different from your first symptoms. MAKE SURE YOU:   Understand these instructions.  Will watch your condition.  Will get help right away if you are not doing well or get worse. Document Released: 03/17/2005 Document Revised: 01/05/2013 Document Reviewed: 11/22/2012 Exit[employer] Patient Information 2014 [employer].  

## 2013-07-26 NOTE — Discharge Instructions (Signed)
Migraine Headache A migraine headache is an intense, throbbing pain on one or both sides of your head. A migraine can last for 30 minutes to several hours. CAUSES  The exact cause of a migraine headache is not always known. However, a migraine may be caused when nerves in the brain become irritated and release chemicals that cause inflammation. This causes pain. Certain things may also trigger migraines, such as:  Alcohol.  Smoking.  Stress.  Menstruation.  Aged cheeses.  Foods or drinks that contain nitrates, glutamate, aspartame, or tyramine.  Lack of sleep.  Chocolate.  Caffeine.  Hunger.  Physical exertion.  Fatigue.  Medicines used to treat chest pain (nitroglycerine), birth control pills, estrogen, and some blood pressure medicines. SIGNS AND SYMPTOMS  Pain on one or both sides of your head.  Pulsating or throbbing pain.  Severe pain that prevents daily activities.  Pain that is aggravated by any physical activity.  Nausea, vomiting, or both.  Dizziness.  Pain with exposure to bright lights, loud noises, or activity.  General sensitivity to bright lights, loud noises, or smells. Before you get a migraine, you may get warning signs that a migraine is coming (aura). An aura may include:  Seeing flashing lights.  Seeing bright spots, halos, or zig-zag lines.  Having tunnel vision or blurred vision.  Having feelings of numbness or tingling.  Having trouble talking.  Having muscle weakness. DIAGNOSIS  A migraine headache is often diagnosed based on:  Symptoms.  Physical exam.  A CT scan or MRI of your head. These imaging tests cannot diagnose migraines, but they can help rule out other causes of headaches. TREATMENT Medicines may be given for pain and nausea. Medicines can also be given to help prevent recurrent migraines.  HOME CARE INSTRUCTIONS  Only take over-the-counter or prescription medicines for pain or discomfort as directed by your  health care provider. The use of long-term narcotics is not recommended.  Lie down in a dark, quiet room when you have a migraine.  Keep a journal to find out what may trigger your migraine headaches. For example, write down:  What you eat and drink.  How much sleep you get.  Any change to your diet or medicines.  Limit alcohol consumption.  Quit smoking if you smoke.  Get 7 9 hours of sleep, or as recommended by your health care provider.  Limit stress.  Keep lights dim if bright lights bother you and make your migraines worse. SEEK IMMEDIATE MEDICAL CARE IF:   Your migraine becomes severe.  You have a fever.  You have a stiff neck.  You have vision loss.  You have muscular weakness or loss of muscle control.  You start losing your balance or have trouble walking.  You feel faint or pass out.  You have severe symptoms that are different from your first symptoms. MAKE SURE YOU:   Understand these instructions.  Will watch your condition.  Will get help right away if you are not doing well or get worse. Document Released: 03/17/2005 Document Revised: 01/05/2013 Document Reviewed: 11/22/2012 Exit[employer] Patient Information 2014 [employer].  

## 2013-08-19 ENCOUNTER — Ambulatory Visit: Payer: 59 | Admitting: Neurology

## 2013-09-01 ENCOUNTER — Encounter: Payer: Self-pay | Admitting: *Deleted

## 2013-09-02 ENCOUNTER — Ambulatory Visit (INDEPENDENT_AMBULATORY_CARE_PROVIDER_SITE_OTHER): Payer: 59 | Admitting: Neurology

## 2013-09-02 ENCOUNTER — Encounter: Payer: Self-pay | Admitting: Neurology

## 2013-09-02 ENCOUNTER — Encounter (INDEPENDENT_AMBULATORY_CARE_PROVIDER_SITE_OTHER): Payer: Self-pay

## 2013-09-02 VITALS — BP 131/87 | HR 84 | Ht 69.0 in | Wt 285.0 lb

## 2013-09-02 DIAGNOSIS — G43009 Migraine without aura, not intractable, without status migrainosus: Secondary | ICD-10-CM

## 2013-09-02 NOTE — Progress Notes (Signed)
Reason for visit: Migraine headache  Michelle Thomas is an 38 y.o. female  History of present illness:  Michelle Thomas is a 38 year old right-handed black female with a history of migraine headache. She was last seen a little over one year ago for her migraine. At that time, the headaches were occurring on average once a month. The patient was given Imitrex to take for her headache, but she indicates that even the tablets made her nauseated. Currently, she takes Excedrin Migraine and Fioricet for the headache which appears to be relatively effective. At the end of April 2015, she had a prolonged headache lasting 8 or 9 days. The patient required an emergency room visit to get rid of the headache. She had nausea and vomiting with the headache. The patient indicates that her usual triggers are lack of sleep, missing a meal, stress, and dehydration. She is now doing quite well with her headache again. She returns to this office for an evaluation.  Past Medical History  Diagnosis Date  . Migraine without aura, without mention of intractable migraine without mention of status migrainosus 06/18/2012  . Obesity     Past Surgical History  Procedure Laterality Date  . Wisdom tooth extraction      Family History  Problem Relation Age of Onset  . Hypertension Father   . Diabetes Father   . Cancer Father     colon cancer  . Headache Sister   . Diabetes Sister     Social history:  reports that she has never smoked. She has never used smokeless tobacco. She reports that she drinks alcohol. She reports that she does not use illicit drugs.    Allergies  Allergen Reactions  . Codeine     Rash  . Penicillins Cross Reactors Hives    Medications:  Current Outpatient Prescriptions on File Prior to Visit  Medication Sig Dispense Refill  . aspirin-acetaminophen-caffeine (EXCEDRIN MIGRAINE) 250-250-65 MG per tablet Take 1 tablet by mouth every 6 (six) hours as needed for pain.      .  butalbital-acetaminophen-caffeine (FIORICET) 50-325-40 MG per tablet Take 1 tablet by mouth every 6 (six) hours as needed for headache.  20 tablet  0  . promethazine (PHENERGAN) 25 MG tablet Take 25 mg by mouth every 8 (eight) hours as needed for nausea or vomiting.      . traMADol (ULTRAM) 50 MG tablet Take 50 mg by mouth at bedtime.       No current facility-administered medications on file prior to visit.    ROS:  Out of a complete 14 system review of symptoms, the patient complains only of the following symptoms, and all other reviewed systems are negative.  Headache Dizziness  Blood pressure 131/87, pulse 84, height 5\' 9"  (1.753 m), weight 285 lb (129.275 kg).  Physical Exam  General: The patient is alert and cooperative at the time of the examination. The patient is moderately to markedly obese.  Skin: No significant peripheral edema is noted.   Neurologic Exam  Mental status: The patient is oriented x 3.  Cranial nerves: Facial symmetry is present. Speech is normal, no aphasia or dysarthria is noted. Extraocular movements are full. Visual fields are full.  Motor: The patient has good strength in all 4 extremities.  Sensory examination: Soft touch sensation is symmetric on the face, arms, and legs.  Coordination: The patient has good finger-nose-finger and heel-to-shin bilaterally.  Gait and station: The patient has a normal gait. Tandem gait is normal.  Romberg is negative. No drift is seen.  Reflexes: Deep tendon reflexes are symmetric.   Assessment/Plan:  1. Migraine headache  The patient is doing fairly well with her headaches currently, at her usual baseline with having one headache a month. The patient will followup through this office in 6 months. If she gets another prolonged headache, she is to contact our office for an infusion, possibly a course of prednisone.   Jill Alexanders MD 09/02/2013 3:28 PM  [facility] [address]  [place], [place] [address]  Phone [phone] Fax [phone]

## 2013-09-02 NOTE — Patient Instructions (Signed)
Migraine Headache A migraine headache is an intense, throbbing pain on one or both sides of your head. A migraine can last for 30 minutes to several hours. CAUSES  The exact cause of a migraine headache is not always known. However, a migraine may be caused when nerves in the brain become irritated and release chemicals that cause inflammation. This causes pain. Certain things may also trigger migraines, such as:  Alcohol.  Smoking.  Stress.  Menstruation.  Aged cheeses.  Foods or drinks that contain nitrates, glutamate, aspartame, or tyramine.  Lack of sleep.  Chocolate.  Caffeine.  Hunger.  Physical exertion.  Fatigue.  Medicines used to treat chest pain (nitroglycerine), birth control pills, estrogen, and some blood pressure medicines. SIGNS AND SYMPTOMS  Pain on one or both sides of your head.  Pulsating or throbbing pain.  Severe pain that prevents daily activities.  Pain that is aggravated by any physical activity.  Nausea, vomiting, or both.  Dizziness.  Pain with exposure to bright lights, loud noises, or activity.  General sensitivity to bright lights, loud noises, or smells. Before you get a migraine, you may get warning signs that a migraine is coming (aura). An aura may include:  Seeing flashing lights.  Seeing bright spots, halos, or zig-zag lines.  Having tunnel vision or blurred vision.  Having feelings of numbness or tingling.  Having trouble talking.  Having muscle weakness. DIAGNOSIS  A migraine headache is often diagnosed based on:  Symptoms.  Physical exam.  A CT scan or MRI of your head. These imaging tests cannot diagnose migraines, but they can help rule out other causes of headaches. TREATMENT Medicines may be given for pain and nausea. Medicines can also be given to help prevent recurrent migraines.  HOME CARE INSTRUCTIONS  Only take over-the-counter or prescription medicines for pain or discomfort as directed by your  health care provider. The use of long-term narcotics is not recommended.  Lie down in a dark, quiet room when you have a migraine.  Keep a journal to find out what may trigger your migraine headaches. For example, write down:  What you eat and drink.  How much sleep you get.  Any change to your diet or medicines.  Limit alcohol consumption.  Quit smoking if you smoke.  Get 7 9 hours of sleep, or as recommended by your health care provider.  Limit stress.  Keep lights dim if bright lights bother you and make your migraines worse. SEEK IMMEDIATE MEDICAL CARE IF:   Your migraine becomes severe.  You have a fever.  You have a stiff neck.  You have vision loss.  You have muscular weakness or loss of muscle control.  You start losing your balance or have trouble walking.  You feel faint or pass out.  You have severe symptoms that are different from your first symptoms. MAKE SURE YOU:   Understand these instructions.  Will watch your condition.  Will get help right away if you are not doing well or get worse. Document Released: 03/17/2005 Document Revised: 01/05/2013 Document Reviewed: 11/22/2012 Exit[employer] Patient Information 2014 [employer].  

## 2013-11-03 ENCOUNTER — Ambulatory Visit (INDEPENDENT_AMBULATORY_CARE_PROVIDER_SITE_OTHER): Payer: 59 | Admitting: Family Medicine

## 2013-11-03 VITALS — BP 132/80 | HR 97 | Temp 98.2°F | Resp 17 | Ht 69.0 in | Wt 285.0 lb

## 2013-11-03 DIAGNOSIS — N898 Other specified noninflammatory disorders of vagina: Secondary | ICD-10-CM

## 2013-11-03 DIAGNOSIS — R300 Dysuria: Secondary | ICD-10-CM

## 2013-11-03 DIAGNOSIS — M545 Low back pain, unspecified: Secondary | ICD-10-CM

## 2013-11-03 LAB — POCT UA - MICROSCOPIC ONLY
BACTERIA, U MICROSCOPIC: NEGATIVE
CRYSTALS, UR, HPF, POC: NEGATIVE
Casts, Ur, LPF, POC: NEGATIVE
Mucus, UA: NEGATIVE
RBC, URINE, MICROSCOPIC: NEGATIVE
WBC, Ur, HPF, POC: NEGATIVE
Yeast, UA: NEGATIVE

## 2013-11-03 LAB — POCT URINALYSIS DIPSTICK
Bilirubin, UA: NEGATIVE
Glucose, UA: NEGATIVE
KETONES UA: NEGATIVE
Leukocytes, UA: NEGATIVE
Nitrite, UA: NEGATIVE
PROTEIN UA: NEGATIVE
RBC UA: NEGATIVE
SPEC GRAV UA: 1.02
Urobilinogen, UA: 0.2
pH, UA: 5.5

## 2013-11-03 LAB — POCT WET PREP WITH KOH
CLUE CELLS WET PREP PER HPF POC: NEGATIVE
KOH Prep POC: NEGATIVE
Trichomonas, UA: NEGATIVE
YEAST WET PREP PER HPF POC: NEGATIVE

## 2013-11-03 NOTE — Progress Notes (Addendum)
Subjective:    Patient ID: Michelle Thomas, female    DOB: 09/17/1975, 38 y.o.   MRN: [id number] This chart was scribed for Michelle Ray, MD by Randa Evens, ED Scribe. This Patient was seen in room 03 and the patients care was started at 9:34 PM  Chief Complaint  Patient presents with  . Vaginitis  . Back Pain    Back Pain Associated symptoms include a fever.   HPI Comments: Michelle Thomas is a 38 y.o. female who presents to the [facility] complaining of lower back pain onset 2 weeks prior. She states frequent urination, vaginal irritation that recently worsened over the past 2 days , subjective fever. States she hasn't noticed any new discharge. States she was not able to get evaluated with obgyn. States she received yeast infection medications with no relief.  Denies bowel/bladder incontinence.  She denies any new sexual partners.  Patient's last menstrual period was 09/03/2013.  Has irregular periods in the past with periods every few months.    Patient Active Problem List   Diagnosis Date Noted  . Migraine without aura, without mention of intractable migraine without mention of status migrainosus 06/18/2012   Past Medical History  Diagnosis Date  . Migraine without aura, without mention of intractable migraine without mention of status migrainosus 06/18/2012  . Obesity    Past Surgical History  Procedure Laterality Date  . Wisdom tooth extraction     Allergies  Allergen Reactions  . Codeine     Rash  . Penicillins Cross Reactors Hives   Prior to Admission medications   Medication Sig Start Date End Date Taking? Authorizing Provider  aspirin-acetaminophen-caffeine (EXCEDRIN MIGRAINE) [phone] MG per tablet Take 1 tablet by mouth every 6 (six) hours as needed for pain.   Yes Historical Provider, MD  promethazine (PHENERGAN) 25 MG tablet Take 25 mg by mouth every 8 (eight) hours as needed for nausea or vomiting.   Yes  Historical Provider, MD   History   Social History  . Marital Status: Single    Spouse Name: N/A    Number of Children: 0  . Years of Education: BA   Occupational History  . Clinical research associate of [employer]   Social History Main Topics  . Smoking status: Never Smoker   . Smokeless tobacco: Never Used  . Alcohol Use: Yes     Comment: 1 weekly  . Drug Use: No  . Sexual Activity: No   Other Topics Concern  . Not on file   Social History Narrative  . No narrative on file    Review of Systems  Constitutional: Positive for fever.  Genitourinary: Positive for frequency, vaginal discharge and vaginal pain.  Musculoskeletal: Positive for back pain.     Objective:   Filed Vitals:   11/03/13 2053  BP: 132/80  Pulse: 97  Temp: 98.2 F (36.8 C)  TempSrc: Oral  Resp: 17  Height: 5\' 9"  (1.753 m)  Weight: 285 lb (129.275 kg)  SpO2: 99%     Physical Exam  Nursing note and vitals reviewed. Constitutional: She is oriented to person, place, and time. She appears well-developed and well-nourished. No distress.  HENT:  Head: Normocephalic and atraumatic.  Eyes: Conjunctivae and EOM are normal.  Neck: Neck supple. No tracheal deviation present.  Cardiovascular: Normal rate.   Pulmonary/Chest: Effort normal. No respiratory distress.  Abdominal: She exhibits no distension and no mass. There is tenderness in the right lower quadrant. There  is no rebound, no guarding and no CVA tenderness.  No bowel or bladder incontinence  Genitourinary: Vagina normal. There is no lesion or injury on the right labia. There is no lesion or injury on the left labia. No erythema, tenderness or bleeding around the vagina. No foreign body around the vagina. No signs of injury around the vagina.  Somewhat difficult exam/visualizing cervix, but no blood or clots in vaginal vault. minimla clear discharge. No CMT or adnexal ttp,  Musculoskeletal: Normal range of motion.  Right para spinal  tenderness L4 L5,   Neurological: She is alert and oriented to person, place, and time.  Skin: Skin is warm and dry.  Psychiatric: She has a normal mood and affect. Her behavior is normal.   Results for orders placed in visit on 11/03/13  POCT UA - MICROSCOPIC ONLY      Result Value Ref Range   WBC, Ur, HPF, POC neg     RBC, urine, microscopic neg     Bacteria, U Microscopic neg     Mucus, UA neg     Epithelial cells, urine per micros 0-3     Crystals, Ur, HPF, POC neg     Casts, Ur, LPF, POC neg     Yeast, UA neg    POCT URINALYSIS DIPSTICK      Result Value Ref Range   Color, UA yellow     Clarity, UA clear     Glucose, UA neg     Bilirubin, UA neg     Ketones, UA neg     Spec Grav, UA 1.020     Blood, UA neg     pH, UA 5.5     Protein, UA neg     Urobilinogen, UA 0.2     Nitrite, UA neg     Leukocytes, UA Negative    POCT WET PREP WITH KOH      Result Value Ref Range   Trichomonas, UA Negative     Clue Cells Wet Prep HPF POC neg     Epithelial Wet Prep HPF POC 2-5     Yeast Wet Prep HPF POC neg     Bacteria Wet Prep HPF POC small     RBC Wet Prep HPF POC 0-1     WBC Wet Prep HPF POC 3-6     KOH Prep POC Negative       Assessment & Plan:   Elouise A Antillon is a 38 y.o. female Dysuria - Plan: POCT UA - Microscopic Only, POCT urinalysis dipstick, Vaginal discharge - Plan: GC/Chlamydia Probe Amp, POCT Wet Prep with KOH  -Reassuring U/A and wet prep without apparent acute findings. Only a few WBC, and without apparent CMT or significant adnexal ttp (mild lower R abdominal discomfort with deep palpation and without guarding). Will check uriprobe. Sx care for external vaginal irritation. rtc precautions.   -hx of irregular menses. No recent change or bleeding. hcg was inadvertantly not obtained in office - will return Friday afternoon to have this checked, then I will call her with the results. If positive, may need ultrasound eval for slight R sided discomfort.    Low back pain, unspecified back pain laterality, with sciatica presence unspecified.   - R paraspinal, msk cause likely. NKI.  Back care manual for tx and HEP.  and sx care discussed. rtc if not improving in next 1-2 weeks. Sooner if worse.    No orders of the defined types were placed in this encounter.  Patient Instructions  You should receive a call or letter about your lab results within the next week to 10 days.    Ok to apply Desitin to outside/irritated area if needed - especially before urinating. If you have any worsening of the abdominal discomfort, any fever, or other worsening problems - return here or emergency room.   Your urine test was normal. Your low back pain can be treated with advil or tylenol if needed and see other information in the back care manual. If this is not improving in next week (or worsening sooner)- return for recheck.   Return to the clinic or go to the nearest emergency room if any of your symptoms worsen or new symptoms occur.     I personally performed the services described in this documentation, which was scribed in my presence. The recorded information has been reviewed and considered, and addended by me as needed.

## 2013-11-03 NOTE — Patient Instructions (Signed)
You should receive a call or letter about your lab results within the next week to 10 days.    Ok to apply Desitin to outside/irritated area if needed - especially before urinating. If you have any worsening of the abdominal discomfort, any fever, or other worsening problems - return here or emergency room.   Your urine test was normal. Your low back pain can be treated with advil or tylenol if needed and see other information in the back care manual. If this is not improving in next week (or worsening sooner)- return for recheck.   Return to the clinic or go to the nearest emergency room if any of your symptoms worsen or new symptoms occur.

## 2013-11-05 LAB — GC/CHLAMYDIA PROBE AMP
CT Probe RNA: NEGATIVE
GC Probe RNA: NEGATIVE

## 2013-11-16 ENCOUNTER — Encounter: Payer: Self-pay | Admitting: Family Medicine

## 2013-11-16 ENCOUNTER — Telehealth: Payer: Self-pay

## 2013-11-16 NOTE — Telephone Encounter (Signed)
Pt called back and LM on lab VM regarding labs. Called her back and let her know everything was negative.

## 2014-02-02 ENCOUNTER — Ambulatory Visit (INDEPENDENT_AMBULATORY_CARE_PROVIDER_SITE_OTHER): Payer: 59 | Admitting: Family Medicine

## 2014-02-02 VITALS — BP 118/74 | HR 88 | Temp 98.6°F | Resp 18 | Ht 69.0 in | Wt 283.0 lb

## 2014-02-02 DIAGNOSIS — B372 Candidiasis of skin and nail: Secondary | ICD-10-CM

## 2014-02-02 DIAGNOSIS — R21 Rash and other nonspecific skin eruption: Secondary | ICD-10-CM

## 2014-02-02 MED ORDER — NYSTATIN 100000 UNIT/GM EX CREA: 1.0000 "application " | TOPICAL_CREAM | Freq: Three times a day (TID) | CUTANEOUS | Status: DC

## 2014-02-02 MED ORDER — NYSTATIN 100000 UNIT/GM EX POWD: CUTANEOUS | Status: DC

## 2014-02-02 NOTE — Progress Notes (Signed)
Chief Complaint:  Chief Complaint  Patient presents with  . Rash    itchy under breast x2 weeks   . Hip Pain    lt for a while now     HPI: Michelle Thomas is a 38 y.o. female who is here for  2 week hix of rash underenath skin, under both breast, itchy, she sweats a lot. She has had no fevers or chills. Has tried gold bond for this..  No prior hx of skin infection, no DM. She does not want to talk about her hip pain at this time.    Past Medical History  Diagnosis Date  . Migraine without aura, without mention of intractable migraine without mention of status migrainosus 06/18/2012  . Obesity    Past Surgical History  Procedure Laterality Date  . Wisdom tooth extraction     History   Social History  . Marital Status: Single    Spouse Name: N/A    Number of Children: 0  . Years of Education: BA   Occupational History  . 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 Smoking status: Never Smoker   . Smokeless tobacco: Never Used  . Alcohol Use: Yes     Comment: 1 weekly  . Drug Use: No  . Sexual Activity: No   Other Topics Concern  . None   Social History Narrative   Family History  Problem Relation Age of Onset  . Hypertension Father   . Diabetes Father   . Cancer Father     colon cancer  . Headache Sister   . Diabetes Sister    Allergies  Allergen Reactions  . Codeine     Rash  . Penicillins Cross Reactors Hives   Prior to Admission medications   Medication Sig Start Date End Date Taking? Authorizing Provider  aspirin-acetaminophen-caffeine (EXCEDRIN MIGRAINE) [phone] MG per tablet Take 1 tablet by mouth every 6 (six) hours as needed for pain.   Yes Historical Provider, MD  promethazine (PHENERGAN) 25 MG tablet Take 25 mg by mouth every 8 (eight) hours as needed for nausea or vomiting.   Yes Historical Provider, MD     ROS: The patient denies fevers, chills, night sweats, unintentional weight loss,  chest pain, palpitations, wheezing, dyspnea on exertion, nausea, vomiting, abdominal pain, dysuria, hematuria, melena, numbness, weakness, or tingling.  All other systems have been reviewed and were otherwise negative with the exception of those mentioned in the HPI and as above.    PHYSICAL EXAM: Filed Vitals:   02/02/14 0824  BP: 118/74  Pulse: 88  Temp: 98.6 F (37 C)  Resp: 18   Filed Vitals:   02/02/14 0824  Height: 5\' 9"  (1.753 m)  Weight: 283 lb (128.368 kg)   Body mass index is 41.77 kg/(m^2).  General: Alert, no acute distress HEENT:  Normocephalic, atraumatic, oropharynx patent. EOMI, PERRLA Cardiovascular:  Regular rate and rhythm, no rubs murmurs or gallops.  No Carotid bruits, radial pulse intact. No pedal edema.  Respiratory: Clear to auscultation bilaterally.  No wheezes, rales, or rhonchi.  No cyanosis, no use of accessory musculature GI: No organomegaly, abdomen is soft and non-tender, positive bowel sounds.  No masses. Skin: + candida rashes underneath breast Neurologic: Facial musculature symmetric. Psychiatric: Patient is appropriate throughout our interaction. Lymphatic: No cervical lymphadenopathy Musculoskeletal: Gait intact.   LABS:    EKG/XRAY:   Primary read interpreted by Dr. Marin Comment at [facility].  ASSESSMENT/PLAN: Encounter Diagnoses  Name Primary?  . Candidiasis of skin Yes  . Rash and nonspecific skin eruption    Nystatin podwer and cream, to use as needed depending on which formulation she likes better F/u prn for hip pain since she is in a rush to leave  Gross sideeffects, risk and benefits, and alternatives of medications d/w patient. Patient is aware that all medications have potential sideeffects and we are unable to predict every sideeffect or drug-drug interaction that may occur.  Duvid Smalls, [place], DO 02/02/2014 9:40 AM

## 2014-02-02 NOTE — Patient Instructions (Signed)
Nystatin topical powder What is this medicine? NYSTATIN (nye STAT in) is an antifungal medicine. It is used to treat certain kinds of fungal or yeast infections of the skin. This medicine may be used for other purposes; ask your health care provider or pharmacist if you have questions. COMMON BRAND NAME(S): Mycostatin, Nyamyc, Nystop, Pedi-Dri What should I tell my health care provider before I take this medicine? They need to know if you have any of these conditions: -an unusual or allergic reaction to nystatin, other foods, dyes or preservatives -pregnant or trying to get pregnant -breast-feeding How should I use this medicine? This medicine is for external use on the skin only. Follow the directions on the prescription label. Dust the powder on the affected area (or into socks and shoes). If you are treating diaper rash, do not use tight-fitting diapers or plastic pants. Do not get the medicine in your eyes. If you do, rinse out with plenty of cool tap water. Do not breathe in the powder. Do not use your medicine more often than directed. Use your doses at regular intervals. Finish the full course prescribed by your doctor or health care professional even if you think your condition is better. Do not stop using except on the advice of your doctor or health care professional. Talk to your pediatrician regarding the use of this medicine in children. Special care may be needed. Overdosage: If you think you have taken too much of this medicine contact a poison control center or emergency room at once. NOTE: This medicine is only for you. Do not share this medicine with others. What if I miss a dose? If you miss a dose, use it as soon as you can. If it is almost time for your next dose, use only that dose. Do not use double or extra doses. What may interact with this medicine? Interactions are not expected. Do not use any other skin products on the affected area without telling your doctor or health  care professional. This list may not describe all possible interactions. Give your health care provider a list of all the medicines, herbs, non-prescription drugs, or dietary supplements you use. Also tell them if you smoke, drink alcohol, or use illegal drugs. Some items may interact with your medicine. What should I watch for while using this medicine? Tell your doctor or health care professional if your symptoms do not improve after 3 days. After bathing make sure that your skin is very dry. Fungal infections like moist conditions. Do not walk around barefoot. To help prevent reinfection, wear freshly washed cotton, not synthetic, clothing. What side effects may I notice from receiving this medicine? Side effects that usually do not require medical attention (report to your doctor or health care professional if they continue or are bothersome): -skin irritation This list may not describe all possible side effects. Call your doctor for medical advice about side effects. You may report side effects to FDA at 1-800-FDA-1088. Where should I keep my medicine? Keep out of the reach of children. Store at room temperature between 15 and 30 degrees C (59 and 86 degrees F). Throw away any unused medicine after the expiration date. NOTE: This sheet is a summary. It may not cover all possible information. If you have questions about this medicine, talk to your doctor, pharmacist, or health care provider.  2015, Elsevier/Gold Standard. (2007-10-11 14:28:12)

## 2014-02-02 NOTE — Progress Notes (Deleted)
Chief Complaint:  Chief Complaint  Patient presents with  . Rash    itchy under breast x2 weeks   . Hip Pain    lt for a while now     HPI: Michelle Thomas is a 38 y.o. female who is here for rash under breast x2 weeks that has turned black. Has been using gold bond cream.  Past Medical History  Diagnosis Date  . Migraine without aura, without mention of intractable migraine without mention of status migrainosus 06/18/2012  . Obesity    Past Surgical History  Procedure Laterality Date  . Wisdom tooth extraction     History   Social History  . Marital Status: Single    Spouse Name: N/A    Number of Children: 0  . 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 Smoking status: Never Smoker   . Smokeless tobacco: Never Used  . Alcohol Use: Yes     Comment: 1 weekly  . Drug Use: No  . Sexual Activity: No   Other Topics Concern  . None   Social History Narrative   Family History  Problem Relation Age of Onset  . Hypertension Father   . Diabetes Father   . Cancer Father     colon cancer  . Headache Sister   . Diabetes Sister    Allergies  Allergen Reactions  . Codeine     Rash  . Penicillins Cross Reactors Hives   Prior to Admission medications   Medication Sig Start Date End Date Taking? Authorizing Provider  aspirin-acetaminophen-caffeine (EXCEDRIN MIGRAINE) [phone] MG per tablet Take 1 tablet by mouth every 6 (six) hours as needed for pain.   Yes Historical Provider, MD  promethazine (PHENERGAN) 25 MG tablet Take 25 mg by mouth every 8 (eight) hours as needed for nausea or vomiting.   Yes Historical Provider, MD     ROS: The patient denies fevers, chills, night sweats, unintentional weight loss, chest pain, palpitations, wheezing, dyspnea on exertion, nausea, vomiting, abdominal pain, dysuria, hematuria, melena, numbness, weakness, or tingling. ***  All  other systems have been reviewed and were otherwise negative with the exception of those mentioned in the HPI and as above.    PHYSICAL EXAM: Filed Vitals:   02/02/14 0824  BP: 118/74  Pulse: 88  Temp: 98.6 F (37 C)  Resp: 18   Filed Vitals:   02/02/14 0824  Height: 5\' 9"  (1.753 m)  Weight: 283 lb (128.368 kg)   Body mass index is 41.77 kg/(m^2).  General: Alert, no acute distress HEENT:  Normocephalic, atraumatic, oropharynx patent. EOMI, PERRLA Cardiovascular:  Regular rate and rhythm, no rubs murmurs or gallops.  No Carotid bruits, radial pulse intact. No pedal edema.  Respiratory: Clear to auscultation bilaterally.  No wheezes, rales, or rhonchi.  No cyanosis, no use of accessory musculature GI: No organomegaly, abdomen is soft and non-tender, positive bowel sounds.  No masses. Skin: No rashes. Neurologic: Facial musculature symmetric. Psychiatric: Patient is appropriate throughout our interaction. Lymphatic: No cervical lymphadenopathy Musculoskeletal: Gait intact.   LABS: Results for orders placed or performed in visit on 11/03/13  GC/Chlamydia Probe Amp  Result Value Ref Range   CT Probe RNA NEGATIVE    GC Probe RNA NEGATIVE   POCT UA - Microscopic Only  Result Value Ref Range   WBC, Ur, HPF, POC neg    RBC,  urine, microscopic neg    Bacteria, U Microscopic neg    Mucus, UA neg    Epithelial cells, urine per micros 0-3    Crystals, Ur, HPF, POC neg    Casts, Ur, LPF, POC neg    Yeast, UA neg   POCT urinalysis dipstick  Result Value Ref Range   Color, UA yellow    Clarity, UA clear    Glucose, UA neg    Bilirubin, UA neg    Ketones, UA neg    Spec Grav, UA 1.020    Blood, UA neg    pH, UA 5.5    Protein, UA neg    Urobilinogen, UA 0.2    Nitrite, UA neg    Leukocytes, UA Negative   POCT Wet Prep with KOH  Result Value Ref Range   Trichomonas, UA Negative    Clue Cells Wet Prep HPF POC neg    Epithelial Wet Prep HPF POC 2-5    Yeast Wet Prep HPF  POC neg    Bacteria Wet Prep HPF POC small    RBC Wet Prep HPF POC 0-1    WBC Wet Prep HPF POC 3-6    KOH Prep POC Negative      EKG/XRAY:   Primary read interpreted by Dr. Marin Comment at [facility].   ASSESSMENT/PLAN: No diagnosis found.   Gross sideeffects, risk and benefits, and alternatives of medications d/w patient. Patient is aware that all medications have potential sideeffects and we are unable to predict every sideeffect or drug-drug interaction that may occur.  Modesta Messing, [facility] 02/02/2014 9:33 AM

## 2014-03-05 ENCOUNTER — Ambulatory Visit (INDEPENDENT_AMBULATORY_CARE_PROVIDER_SITE_OTHER): Payer: 59 | Admitting: Emergency Medicine

## 2014-03-05 ENCOUNTER — Ambulatory Visit (INDEPENDENT_AMBULATORY_CARE_PROVIDER_SITE_OTHER): Payer: 59

## 2014-03-05 VITALS — BP 126/80 | HR 77 | Temp 98.2°F | Resp 19 | Ht 68.5 in | Wt 283.4 lb

## 2014-03-05 DIAGNOSIS — R059 Cough, unspecified: Secondary | ICD-10-CM

## 2014-03-05 DIAGNOSIS — R0789 Other chest pain: Secondary | ICD-10-CM

## 2014-03-05 DIAGNOSIS — R05 Cough: Secondary | ICD-10-CM

## 2014-03-05 MED ORDER — CYCLOBENZAPRINE HCL 10 MG PO TABS: 10.0000 mg | ORAL_TABLET | Freq: Three times a day (TID) | ORAL | Status: DC | PRN

## 2014-03-05 MED ORDER — NAPROXEN SODIUM 550 MG PO TABS: 550.0000 mg | ORAL_TABLET | Freq: Two times a day (BID) | ORAL | Status: DC

## 2014-03-05 NOTE — Progress Notes (Signed)
[facility] [address] [place] [address] [phone]  Date:  03/05/2014   Name:  Michelle Thomas   DOB:  21-Jul-1975   MRN:  [id number]  PCP:  Vena Austria, MD    Chief Complaint: Chest Pain and Headache   History of Present Illness:  Michelle Thomas is a 38 y.o. very pleasant female patient who presents with the following:  Two day history of left upper chest pain not into shoulder or arm.  Steady since yesterday. Worse with breathing.  No ameliorating factors No cough, shortness of breath or wheezing.  No coryza No nausea or vomiting. No immobilization or anesthesia or travel No OCP, non smoker No DM, HBP or HLD No FH premature CAD No improvement with over the counter medications or other home remedies.  Denies other complaint or health concern today.   Patient Active Problem List   Diagnosis Date Noted  . Migraine without aura, without mention of intractable migraine without mention of status migrainosus 06/18/2012    Past Medical History  Diagnosis Date  . Migraine without aura, without mention of intractable migraine without mention of status migrainosus 06/18/2012  . Obesity     Past Surgical History  Procedure Laterality Date  . Wisdom tooth extraction      History  Substance Use Topics  . Smoking status: Never Smoker   . Smokeless tobacco: Never Used  . Alcohol Use: Yes     Comment: 1 weekly    Family History  Problem Relation Age of Onset  . Hypertension Father   . Diabetes Father   . Cancer Father     colon cancer  . Headache Sister   . Diabetes Sister     Allergies  Allergen Reactions  . Codeine     Rash  . Penicillins Cross Reactors Hives    Medication list has been reviewed and updated.  Current Outpatient Prescriptions on File Prior to Visit  Medication Sig Dispense Refill  . aspirin-acetaminophen-caffeine (EXCEDRIN MIGRAINE) 250-250-65 MG per tablet Take 1 tablet by mouth  every 6 (six) hours as needed for pain.    [name] nystatin (MYCOSTATIN/NYSTOP) 100000 UNIT/GM POWD Apply to affected area TID prn 60 g 1  . nystatin cream (MYCOSTATIN) Apply 1 application topically 3 (three) times daily. 30 g 1  . promethazine (PHENERGAN) 25 MG tablet Take 25 mg by mouth every 8 (eight) hours as needed for nausea or vomiting.     No current facility-administered medications on file prior to visit.    Review of Systems:  As per HPI, otherwise negative.    Physical Examination: Filed Vitals:   03/05/14 1005  BP: 126/80  Pulse: 77  Temp: 98.2 F (36.8 C)  Resp: 19   Filed Vitals:   03/05/14 1005  Height: 5' 8.5" (1.74 m)  Weight: 283 lb 6.4 oz (128.549 kg)   Body mass index is 42.46 kg/(m^2). Ideal Body Weight: Weight in (lb) to have BMI = 25: 166.5  GEN: WDWN, NAD, Non-toxic, A & O x 3 HEENT: Atraumatic, Normocephalic. Neck supple. No masses, No LAD. Ears and Nose: No external deformity. CV: RRR, No M/G/R. No JVD. No thrill. No extra heart sounds. PULM: CTA B, no wheezes, crackles, rhonchi. No retractions. No resp. distress. No accessory muscle use. ABD: S, NT, ND, +BS. No rebound. No HSM. EXTR: No c/c/e NEURO Normal gait.  PSYCH: Normally interactive. Conversant. Not depressed or anxious appearing.  Calm demeanor.    Assessment and  Plan: Chest wall muscle strain Anaprox Flexeril tyl #3  Signed,  Ellison Carwin, MD   [facility] reading (PRIMARY) by  Dr. Ouida Sills. Negative .

## 2014-03-05 NOTE — Patient Instructions (Signed)

## 2014-03-06 ENCOUNTER — Telehealth: Payer: Self-pay | Admitting: Adult Health

## 2014-03-06 ENCOUNTER — Ambulatory Visit: Payer: 59 | Admitting: Adult Health

## 2014-03-06 NOTE — Telephone Encounter (Signed)
Patient no showed for a revisit appointment.  

## 2014-03-07 ENCOUNTER — Encounter: Payer: Self-pay | Admitting: *Deleted

## 2014-04-12 ENCOUNTER — Ambulatory Visit (INDEPENDENT_AMBULATORY_CARE_PROVIDER_SITE_OTHER): Payer: 59 | Admitting: Family Medicine

## 2014-04-12 VITALS — BP 132/86 | HR 87 | Temp 98.0°F | Resp 16 | Ht 68.0 in | Wt 283.0 lb

## 2014-04-12 DIAGNOSIS — R5382 Chronic fatigue, unspecified: Secondary | ICD-10-CM

## 2014-04-12 DIAGNOSIS — R635 Abnormal weight gain: Secondary | ICD-10-CM

## 2014-04-12 LAB — POCT URINALYSIS DIPSTICK
Bilirubin, UA: NEGATIVE
Glucose, UA: NEGATIVE
Ketones, UA: NEGATIVE
Leukocytes, UA: NEGATIVE
Nitrite, UA: NEGATIVE
Protein, UA: NEGATIVE
Spec Grav, UA: 1.025
Urobilinogen, UA: 1
pH, UA: 6.5

## 2014-04-12 LAB — POCT CBC
Granulocyte percent: 60.7 %G (ref 37–80)
HCT, POC: 38.4 % (ref 37.7–47.9)
Hemoglobin: 11.9 g/dL — AB (ref 12.2–16.2)
Lymph, poc: 2.1 (ref 0.6–3.4)
MCH, POC: 23.6 pg — AB (ref 27–31.2)
MCHC: 30.9 g/dL — AB (ref 31.8–35.4)
MCV: 76.4 fL — AB (ref 80–97)
MID (cbc): 0.5 (ref 0–0.9)
MPV: 9 fL (ref 0–99.8)
POC Granulocyte: 3.9 (ref 2–6.9)
POC LYMPH PERCENT: 32 %L (ref 10–50)
POC MID %: 7.3 %M (ref 0–12)
Platelet Count, POC: 237 10*3/uL (ref 142–424)
RBC: 5.03 M/uL (ref 4.04–5.48)
RDW, POC: 15.5 %
WBC: 6.5 10*3/uL (ref 4.6–10.2)

## 2014-04-12 LAB — POCT UA - MICROSCOPIC ONLY
Bacteria, U Microscopic: NEGATIVE
Casts, Ur, LPF, POC: NEGATIVE
Crystals, Ur, HPF, POC: NEGATIVE
Mucus, UA: NEGATIVE
Yeast, UA: NEGATIVE

## 2014-04-12 LAB — FERRITIN: Ferritin: 179 ng/mL (ref 10–291)

## 2014-04-12 LAB — TSH: TSH: 0.486 u[IU]/mL (ref 0.350–4.500)

## 2014-04-12 NOTE — Patient Instructions (Signed)

## 2014-04-12 NOTE — Progress Notes (Signed)
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 She comes in with several months of fatigue. She tried to call her primary care doctor but they were unable to get her in for while.  Patient has a history of low iron and low vitamin D.  Her periods have been regular.  Patient has no localized pain or discomfort.  Objective: No acute distress General appearance is that of an obese woman who is articulate and very appropriate. HEENT: Unremarkable Neck: Supple no adenopathy or thyromegaly Chest: Clear line heart: Regular no murmur Abdomen: Soft nontender Skin: Warm and dry without rash Extremities: No edema  Assessment: Fatigue, unexplained. Obesity with patient interested in losing weight.  Plan:    ICD-9-CM ICD-10-CM   1. Chronic fatigue 780.79 R53.82 POCT CBC     POCT urinalysis dipstick     POCT UA - Microscopic Only     TSH     Vit D  25 hydroxy (rtn osteoporosis monitoring)     Ferritin  2. Weight gain 783.1 R63.5 POCT CBC     POCT urinalysis dipstick     POCT UA - Microscopic Only     TSH     Vit D  25 hydroxy (rtn osteoporosis monitoring)     Ferritin   I explained that phentermine per prescriptions for weight loss did not yield good long-term results and may cause hypertension  Signed, Robyn Haber, MD

## 2014-04-13 LAB — VITAMIN D 25 HYDROXY (VIT D DEFICIENCY, FRACTURES): Vit D, 25-Hydroxy: 9 ng/mL — ABNORMAL LOW (ref 30–100)

## 2014-05-18 ENCOUNTER — Ambulatory Visit (INDEPENDENT_AMBULATORY_CARE_PROVIDER_SITE_OTHER): Payer: 59 | Admitting: Physician Assistant

## 2014-05-18 VITALS — BP 132/77 | HR 85 | Temp 98.1°F | Resp 20 | Ht 69.5 in | Wt 279.1 lb

## 2014-05-18 DIAGNOSIS — T7840XA Allergy, unspecified, initial encounter: Secondary | ICD-10-CM

## 2014-05-18 MED ORDER — RANITIDINE HCL 150 MG PO TABS: 150.0000 mg | ORAL_TABLET | Freq: Every day | ORAL | Status: DC

## 2014-05-18 MED ORDER — METHYLPREDNISOLONE ACETATE 80 MG/ML IJ SUSP
80.0000 mg | Freq: Once | INTRAMUSCULAR | Status: AC
  Administered 2014-05-18: 80 mg via INTRAMUSCULAR

## 2014-05-18 MED ORDER — CETIRIZINE HCL 10 MG PO TABS: 10.0000 mg | ORAL_TABLET | Freq: Every day | ORAL | Status: DC

## 2014-05-18 NOTE — Progress Notes (Signed)
Subjective:    Patient ID: Michelle Thomas, female    DOB: [birth year], 39 y.o.   MRN: [id number]  HPI  This is a 39 year old female with PMH migraine headache who is presenting with allergic reaction x 3 days. She has been taking benadryl which helps her sleep but isn't helping the rash. Rash is present over arms, back, chest and trunk. It is extremely pruritic. She states 4 days ago she started a detox program with pre-made smoothies. There are not ingredients in the smoothies that are known allergens for her. She last had a smoothies 1 day ago. She has never had a reaction like this before. She denies facial swelling or problems breathing.  Review of Systems  Constitutional: Negative for fever and chills.  HENT: Negative for facial swelling.   Respiratory: Negative for shortness of breath.   Cardiovascular: Negative for chest pain.  Gastrointestinal: Negative for nausea and vomiting.  Skin: Positive for rash.  Allergic/Immunologic: Negative for food allergies.  Hematological: Negative for adenopathy.  Psychiatric/Behavioral: Positive for sleep disturbance.    Patient Active Problem List   Diagnosis Date Noted  . Migraine without aura, without mention of intractable migraine without mention of status migrainosus 06/18/2012   Prior to Admission medications   Medication Sig Start Date End Date Taking? Authorizing Provider  aspirin-acetaminophen-caffeine (EXCEDRIN MIGRAINE) [phone] MG per tablet Take 1 tablet by mouth every 6 (six) hours as needed for pain.   Yes Historical Provider, MD  cyclobenzaprine (FLEXERIL) 10 MG tablet Take 1 tablet (10 mg total) by mouth 3 (three) times daily as needed for muscle spasms. 03/05/14  Yes Roselee Culver, MD  naproxen sodium (ANAPROX DS) 550 MG tablet Take 1 tablet (550 mg total) by mouth 2 (two) times daily with a meal. 03/05/14 03/05/15 Yes Roselee Culver, MD  Phentermine-Topiramate 3.75-23 MG CP24 Take 1 tablet by mouth daily.    Yes Historical Provider, MD  promethazine (PHENERGAN) 25 MG tablet Take 25 mg by mouth every 8 (eight) hours as needed for nausea or vomiting.   Yes Historical Provider, MD  Vitamin D, Ergocalciferol, (DRISDOL) 50000 UNITS CAPS capsule Take 50,000 Units by mouth every 7 (seven) days.   Yes Historical Provider, MD   Allergies  Allergen Reactions  . Codeine     Rash  . Penicillins Cross Reactors Hives   Patient's social and family history were reviewed.     Objective:   Physical Exam  Constitutional: She is oriented to person, place, and time. She appears well-developed and well-nourished. No distress.  HENT:  Head: Normocephalic and atraumatic.  Right Ear: Hearing normal.  Left Ear: Hearing normal.  Nose: Nose normal.  Eyes: Conjunctivae and lids are normal. Right eye exhibits no discharge. Left eye exhibits no discharge. No scleral icterus.  Cardiovascular: Normal rate, regular rhythm, intact distal pulses and normal pulses.   Pulmonary/Chest: Effort normal and breath sounds normal. No respiratory distress.  Musculoskeletal: Normal range of motion.  Neurological: She is alert and oriented to person, place, and time.  Skin: Skin is warm, dry and intact.  Urticarial rash over arms, back, chest and abdomen. Marks from scratching present.  + dermatographism  Psychiatric: She has a normal mood and affect. Her speech is normal and behavior is normal. Thought content normal.   BP 132/77 mmHg  Pulse 85  Temp(Src) 98.1 F (36.7 C) (Oral)  Resp 20  Ht 5' 9.5" (1.765 m)  Wt 279 lb 2 oz (126.61 kg)  BMI  40.64 kg/m2  SpO2 100%  LMP 03/14/2014     Assessment & Plan:  1. Allergic reaction, initial encounter Pt given depomedrol in office today. She will take zyrtec in the mornings, zantac and benadryl in the evenings. She will stop the smoothies she has been drinking. She will return in 7-10 days if not getting better.  - methylPREDNISolone acetate (DEPO-MEDROL) injection 80 mg; Inject  1 mL (80 mg total) into the muscle once. - cetirizine (ZYRTEC) 10 MG tablet; Take 1 tablet (10 mg total) by mouth daily.  Dispense: 30 tablet; Refill: 0 - ranitidine (ZANTAC) 150 MG tablet; Take 1 tablet (150 mg total) by mouth at bedtime.  Dispense: 30 tablet; Refill: 0   Benjaman Pott. Drenda Freeze, MHS [facility]  05/18/2014

## 2014-05-18 NOTE — Patient Instructions (Signed)
Take zyrtec in the mornings and zantac in the evenings. Try not to scratch! Stop the smoothies and start making your own. Return if not getting better in 7-10 days.

## 2014-10-23 ENCOUNTER — Ambulatory Visit (INDEPENDENT_AMBULATORY_CARE_PROVIDER_SITE_OTHER): Payer: 59 | Admitting: Family Medicine

## 2014-10-23 ENCOUNTER — Ambulatory Visit (INDEPENDENT_AMBULATORY_CARE_PROVIDER_SITE_OTHER): Payer: 59

## 2014-10-23 VITALS — BP 120/80 | HR 86 | Temp 99.0°F | Resp 16 | Ht 68.5 in | Wt 277.1 lb

## 2014-10-23 DIAGNOSIS — M25561 Pain in right knee: Secondary | ICD-10-CM

## 2014-10-23 MED ORDER — MELOXICAM 7.5 MG PO TABS: 7.5000 mg | ORAL_TABLET | Freq: Every day | ORAL | Status: DC

## 2014-10-23 NOTE — Patient Instructions (Signed)
Please wear the knee brace for the next several weeks.  Please take the meloxicam once daily for the next 4 weeks.  If you're not feeling better in 10 days please let us know and I'll refer you to ortho for further workup.

## 2014-10-23 NOTE — Progress Notes (Signed)
   Subjective:    Patient ID: Michelle Thomas, female    DOB: 30-Nov-1975, 39 y.o.   MRN: [id number]  Chief Complaint  Patient presents with  . Knee Pain    Right   Patient Active Problem List   Diagnosis Date Noted  . Migraine without aura, without mention of intractable migraine without mention of status migrainosus 06/18/2012   Medications, allergies, past medical history, surgical history, family history, social history and problem list reviewed and updated.Michelle Thomas   HPI  29 yof presents with right knee pain.   Sx ongoing and bothersome past 3 wks. No trauma, twisting, or mechanism of injury she can recall. Has had generalized anterior knee pain past few wks while walking. No hx knee probs. No hx osteoarthritis. No pain with up or down stairs. Sometimes feels like knee gives on her when walking.   Review of Systems No fevers, chills.     Objective:   Physical Exam  Constitutional: She is oriented to person, place, and time. She appears well-developed and well-nourished.  Non-toxic appearance. She does not have a sickly appearance. She does not appear ill. No distress.  BP 120/80 mmHg  Pulse 86  Temp(Src) 99 F (37.2 C) (Oral)  Resp 16  Ht 5' 8.5" (1.74 m)  Wt 277 lb 2 oz (125.703 kg)  BMI 41.52 kg/m2  SpO2 99%   Musculoskeletal:       Right hip: Normal.       Right knee: She exhibits normal range of motion, no swelling, no effusion, no LCL laxity, no bony tenderness, normal meniscus and no MCL laxity. Tenderness found. Medial joint line and patellar tendon tenderness noted.       Right ankle: Normal.  No laxity on joint testing. TTP centered over patellar tendon and medial joint line. Negative grind test. No patellar instability.   Neurological: She is alert and oriented to person, place, and time.  Psychiatric: She has a normal mood and affect. Her speech is normal and behavior is normal.   [facility] reading (PRIMARY) by  Dr. Carlota Raspberry. Right knee findings: Possible minimal  spurring off medial tibial plateau. Otherwise no acute bony findings.      Assessment & Plan:   Right knee pain - Plan: DG Knee Complete 4 Views Right, meloxicam (MOBIC) 7.5 MG tablet --unsure of exact etiology with relatively normal xr, negative joint testing and grind test, ttp over patellar tendon and medial joint line, possible tendonitis vs meniscal etiology --knee brace and meloxicam, ice after walking --let us know if no improvement 10 days, likely mri vs ortho referral at that time  [name], PA-C Physician Assistant-Certified [facility]  10/23/2014 9:55 PM

## 2014-10-25 NOTE — Progress Notes (Signed)
Xray read and patient discussed with Mr. Michelle Thomas. Agree with assessment and plan of care per his note.

## 2015-01-17 ENCOUNTER — Encounter (HOSPITAL_COMMUNITY): Payer: Self-pay | Admitting: Emergency Medicine

## 2015-01-17 ENCOUNTER — Emergency Department (HOSPITAL_COMMUNITY)
Admission: EM | Admit: 2015-01-17 | Discharge: 2015-01-17 | Disposition: A | Discharge status: Home / Self Care | Payer: 59 | Source: Home / Self Care | Attending: Family Medicine | Admitting: Family Medicine

## 2015-01-17 DIAGNOSIS — G43019 Migraine without aura, intractable, without status migrainosus: Secondary | ICD-10-CM

## 2015-01-17 LAB — POCT PREGNANCY, URINE: Preg Test, Ur: NEGATIVE

## 2015-01-17 MED ORDER — TRAZODONE HCL 50 MG PO TABS: 25.0000 mg | ORAL_TABLET | Freq: Every day | ORAL | Status: DC

## 2015-01-17 MED ORDER — KETOROLAC TROMETHAMINE 30 MG/ML IJ SOLN
INTRAMUSCULAR | Status: AC
  Filled 2015-01-17: qty 1

## 2015-01-17 MED ORDER — KETOROLAC TROMETHAMINE 30 MG/ML IJ SOLN
30.0000 mg | Freq: Once | INTRAMUSCULAR | Status: AC
  Administered 2015-01-17: 30 mg via INTRAMUSCULAR

## 2015-01-17 NOTE — Discharge Instructions (Signed)
It is a pleasure to see you today.  I believe your headache may be related to the cyclic use of over-the-counter medications (medication overuse headache) used to treat your migraines.   You were given a shot of Toradol 30mg  intramuscularly today for the headache.   I recommend holding use of tylenol and ibuprofen for your headaches at this time.  I am prescribing a medicine to help you sleep: Trazodone 50mg  tablets, take 1/2 tablet by mouth at bedtime only.   I recommend following up with your primary care physician for the headaches if they continue.

## 2015-01-17 NOTE — ED Notes (Signed)
The patient presented to the [facility] with a complaint of a headache with nausea and dizziness that has been occurring for three weeks. She stated that she has had migraines in the past and can usually control them with otc medication but this "feels different".

## 2015-01-17 NOTE — ED Provider Notes (Signed)
CSN: [id number]     Arrival date & time 01/17/15  1857 History   First MD Initiated Contact with Patient 01/17/15 2010     Chief Complaint  Patient presents with  . Headache  . Nausea   (Consider location/radiation/quality/duration/timing/severity/associated sxs/prior Treatment) Patient is a 39 y.o. female presenting with headaches. The history is provided by the patient. No language interpreter was used.  Headache Patient with history of migraine headaches, presents with complaint of daily HAs accompanied by nausea and photophobia for the past 3 weeks. Develop during the day, accompanied by nausea and occasional momentary dizziness when she changes position. No falls or trauma.  Has been treated herself daily with alternating doses of ibuprofen and acetaminophen.  No fevers, no cough or other cold symptoms.  She does not awaken with the headache.  No neck stiffness. At present time the headache is located on L hemicranium and extends slightly to back of head/trapezius.   ROS: No fevers or chills, no cough.  No chest pain. LMP uncertain, very irregular menses. No OCP or other forms of contraception. Takes no medication other than OTC meds for HA.  Last classic migraine several months ago, usually knows her triggers for HA (stressors).  She is dealing with one of those stressors at the present time. Reports poor sleep at baseline.   Past Medical History  Diagnosis Date  . Migraine without aura, without mention of intractable migraine without mention of status migrainosus 06/18/2012  . Obesity    Past Surgical History  Procedure Laterality Date  . Wisdom tooth extraction     Family History  Problem Relation Age of Onset  . Hypertension Father   . Diabetes Father   . Cancer Father     colon cancer  . Headache Sister   . Diabetes Sister    Social History  Substance Use Topics  . Smoking status: Never Smoker   . Smokeless tobacco: Never Used  . Alcohol Use: Yes     Comment: 1 weekly    OB History    No data available     Review of Systems  Neurological: Positive for headaches.    Allergies  Codeine and Penicillins cross reactors  Home Medications   Prior to Admission medications   Medication Sig Start Date End Date Taking? Authorizing Provider  aspirin-acetaminophen-caffeine (EXCEDRIN MIGRAINE) [phone] MG per tablet Take 1 tablet by mouth every 6 (six) hours as needed for pain.    Historical Provider, MD  meloxicam (MOBIC) 7.5 MG tablet Take 1 tablet (7.5 mg total) by mouth daily. 10/23/14   Araceli Bouche, PA  traZODone (DESYREL) 50 MG tablet Take 0.5 tablets (25 mg total) by mouth at bedtime. 01/17/15   Willeen Niece, MD   Meds Ordered and Administered this Visit   Medications  ketorolac (TORADOL) 30 MG/ML injection 30 mg (not administered)    BP 136/89 mmHg  Pulse 76  Temp(Src) 98.4 F (36.9 C) (Oral)  Resp 16  SpO2 100% No data found.   Physical Exam  Constitutional: She appears well-developed and well-nourished. No distress.  Well appearing, somewhat tearful when discussing history. No distress  HENT:  Head: Normocephalic and atraumatic.  Right Ear: External ear normal.  Left Ear: External ear normal.  Nose: Nose normal.  Mouth/Throat: Oropharynx is clear and moist. No oropharyngeal exudate.  Eyes: EOM are normal. Pupils are equal, round, and reactive to light. Right eye exhibits no discharge. Left eye exhibits no discharge. No scleral icterus.  Neck: Normal range  of motion. Neck supple. No thyromegaly present.  Lymphadenopathy:    She has no cervical adenopathy.  Skin: She is not diaphoretic.    ED Course  Procedures (including critical care time)  Labs Review Labs Reviewed - No data to display  Imaging Review No results found.   Visual Acuity Review  Right Eye Distance:   Left Eye Distance:   Bilateral Distance:    Right Eye Near:   Left Eye Near:    Bilateral Near:         MDM   1. Intractable migraine without  aura and without status migrainosus    Patient with history of migraines, now with daily HAs and noted daily alternate dosing of ibuprofen/acetaminophen. Concern for migraine compounded by medication overuse headache. No red flag symptoms or findings on today's evaluation.  Upreg given patient's irregular menses and the association of dizziness and nausea with her headaches.  She is given IM toradol 30mg  today. Encouraged to avoid use of OTC pain meds. Trazodone 25mg  by mouth at bedtime to promote sleep, prophylax against migraine.  For prompt follow up with per primary care doctor, Dr. Alyson Ingles, for further evaluation.     Willeen Niece, MD 01/17/15 2039

## 2015-04-05 ENCOUNTER — Encounter (HOSPITAL_COMMUNITY): Payer: Self-pay

## 2015-04-05 ENCOUNTER — Emergency Department (HOSPITAL_COMMUNITY)
Admission: EM | Admit: 2015-04-05 | Discharge: 2015-04-05 | Disposition: A | Discharge status: Home / Self Care | Payer: 59 | Source: Home / Self Care | Attending: Family Medicine | Admitting: Family Medicine

## 2015-04-05 DIAGNOSIS — G43909 Migraine, unspecified, not intractable, without status migrainosus: Secondary | ICD-10-CM | POA: Diagnosis not present

## 2015-04-05 MED ORDER — TRAZODONE HCL 50 MG PO TABS: 25.0000 mg | ORAL_TABLET | Freq: Every day | ORAL | Status: DC

## 2015-04-05 MED ORDER — KETOROLAC TROMETHAMINE 10 MG PO TABS: 10.0000 mg | ORAL_TABLET | Freq: Four times a day (QID) | ORAL | Status: DC | PRN

## 2015-04-05 MED ORDER — KETOROLAC TROMETHAMINE 60 MG/2ML IM SOLN
INTRAMUSCULAR | Status: AC
  Filled 2015-04-05: qty 2

## 2015-04-05 MED ORDER — KETOROLAC TROMETHAMINE 60 MG/2ML IM SOLN
60.0000 mg | Freq: Once | INTRAMUSCULAR | Status: AC
  Administered 2015-04-05: 60 mg via INTRAMUSCULAR

## 2015-04-05 MED ORDER — TRAZODONE HCL 50 MG PO TABS: 50.0000 mg | ORAL_TABLET | Freq: Every day | ORAL | Status: DC

## 2015-04-05 NOTE — Discharge Instructions (Signed)
Migraine Headache  A migraine headache is very bad, throbbing pain on one or both sides of your head. Talk to your doctor about what things may bring on (trigger) your migraine headaches.  HOME CARE  · Only take medicines as told by your doctor.  · Lie down in a dark, quiet room when you have a migraine.  · Keep a journal to find out if certain things bring on migraine headaches. For example, write down:    What you eat and drink.    How much sleep you get.    Any change to your diet or medicines.  · Lessen how much alcohol you drink.  · Quit smoking if you smoke.  · Get enough sleep.  · Lessen any stress in your life.  · Keep lights dim if bright lights bother you or make your migraines worse.  GET HELP RIGHT AWAY IF:   · Your migraine becomes really bad.  · You have a fever.  · You have a stiff neck.  · You have trouble seeing.  · Your muscles are weak, or you lose muscle control.  · You lose your balance or have trouble walking.  · You feel like you will pass out (faint), or you pass out.  · You have really bad symptoms that are different than your first symptoms.  MAKE SURE YOU:   · Understand these instructions.  · Will watch your condition.  · Will get help right away if you are not doing well or get worse.     This information is not intended to replace advice given to you by your health care provider. Make sure you discuss any questions you have with your health care provider.     Document Released: 12/25/2007 Document Revised: 06/09/2011 Document Reviewed: 11/22/2012  Elsevier Interactive Patient Education ©2016 [employer]

## 2015-04-05 NOTE — ED Notes (Signed)
Patient complains of having a headache for the past four days Patient has a history of migraines

## 2015-04-05 NOTE — ED Provider Notes (Signed)
CSN: [id number]     Arrival date & time 04/05/15  1729 History   First MD Initiated Contact with Patient 04/05/15 1932     Chief Complaint  Patient presents with  . Headache   (Consider location/radiation/quality/duration/timing/severity/associated sxs/prior Treatment) HPI History obtained from patient:   LOCATION:head SEVERITY:6 DURATION: 4 days CONTEXT: Sudden onset, stress at work QUALITY: Similar to other migraines in the past MODIFYING FACTORS: Excedrin Migraine not helping ASSOCIATED SYMPTOMS: Nausea TIMING: Constant    Past Medical History  Diagnosis Date  . Migraine without aura, without mention of intractable migraine without mention of status migrainosus 06/18/2012  . Obesity    Past Surgical History  Procedure Laterality Date  . Wisdom tooth extraction     Family History  Problem Relation Age of Onset  . Hypertension Father   . Diabetes Father   . Cancer Father     colon cancer  . Headache Sister   . Diabetes Sister    Social History  Substance Use Topics  . Smoking status: Never Smoker   . Smokeless tobacco: Never Used  . Alcohol Use: Yes     Comment: 1 weekly   OB History    No data available     Review of Systems ROS +'ve headache  Denies:  NAUSEA, ABDOMINAL PAIN, CHEST PAIN, CONGESTION, DYSURIA, SHORTNESS OF BREATH  Allergies  Codeine and Penicillins cross reactors  Home Medications   Prior to Admission medications   Medication Sig Start Date End Date Taking? Authorizing Provider  aspirin-acetaminophen-caffeine (EXCEDRIN MIGRAINE) [phone] MG per tablet Take 1 tablet by mouth every 6 (six) hours as needed for pain.    Historical Provider, MD  meloxicam (MOBIC) 7.5 MG tablet Take 1 tablet (7.5 mg total) by mouth daily. 10/23/14   Araceli Bouche, PA  traZODone (DESYREL) 50 MG tablet Take 0.5 tablets (25 mg total) by mouth at bedtime. 01/17/15   Willeen Niece, MD   Meds Ordered and Administered this Visit   Medications  ketorolac (TORADOL)  injection 60 mg (not administered)    BP 169/89 mmHg  Pulse 79  Temp(Src) 98.3 F (36.8 C) (Oral)  Resp 16  SpO2 100% No data found.   Physical Exam  Constitutional: She is oriented to person, place, and time. She appears well-developed and well-nourished.  HENT:  Head: Normocephalic and atraumatic.  Right Ear: External ear normal.  Left Ear: External ear normal.  Mouth/Throat: Oropharynx is clear and moist.  Eyes: Conjunctivae and EOM are normal. Pupils are equal, round, and reactive to light.  Venous pulsations noted in both eyes.  Neck: Normal range of motion. Neck supple.  Cardiovascular: Normal rate.   Pulmonary/Chest: Effort normal and breath sounds normal.  Neurological: She is alert and oriented to person, place, and time.  Skin: Skin is warm and dry.  Psychiatric: She has a normal mood and affect. Her behavior is normal. Judgment and thought content normal.  Nursing note and vitals reviewed.   ED Course  Procedures (including critical care time)  Labs Review Labs Reviewed - No data to display  Imaging Review No results found.   Visual Acuity Review  Right Eye Distance:   Left Eye Distance:   Bilateral Distance:    Right Eye Near:   Left Eye Near:    Bilateral Near:        Pt states she is much better after toradol injection. She is able to get a ride home.  MDM   1. Migraine without status migrainosus, not  intractable, unspecified migraine type      Patient will be administered 60 mg of IM Toradol. She is advised that she should find a ride home. Prescription for trazodone will be sent to her pharmacy. She is advised to follow with neurology or primary care provider if symptoms fully improved.    Konrad Felix, [place] 04/05/15 2010

## 2015-10-04 ENCOUNTER — Encounter (HOSPITAL_COMMUNITY): Payer: Self-pay | Admitting: *Deleted

## 2015-10-04 ENCOUNTER — Ambulatory Visit (HOSPITAL_COMMUNITY)
Admission: EM | Admit: 2015-10-04 | Discharge: 2015-10-04 | Disposition: A | Discharge status: Home / Self Care | Payer: 59 | Attending: Emergency Medicine | Admitting: Emergency Medicine

## 2015-10-04 DIAGNOSIS — G43009 Migraine without aura, not intractable, without status migrainosus: Secondary | ICD-10-CM

## 2015-10-04 MED ORDER — DEXAMETHASONE SODIUM PHOSPHATE 10 MG/ML IJ SOLN
10.0000 mg | Freq: Once | INTRAMUSCULAR | Status: AC
  Administered 2015-10-04: 10 mg via INTRAMUSCULAR

## 2015-10-04 MED ORDER — KETOROLAC TROMETHAMINE 30 MG/ML IJ SOLN
INTRAMUSCULAR | Status: AC
  Filled 2015-10-04: qty 1

## 2015-10-04 MED ORDER — KETOROLAC TROMETHAMINE 30 MG/ML IJ SOLN
30.0000 mg | Freq: Once | INTRAMUSCULAR | Status: AC
  Administered 2015-10-04: 30 mg via INTRAMUSCULAR

## 2015-10-04 MED ORDER — DEXAMETHASONE SODIUM PHOSPHATE 10 MG/ML IJ SOLN
INTRAMUSCULAR | Status: AC
  Filled 2015-10-04: qty 1

## 2015-10-04 MED ORDER — DIPHENHYDRAMINE HCL 50 MG/ML IJ SOLN
25.0000 mg | Freq: Once | INTRAMUSCULAR | Status: AC
  Administered 2015-10-04: 25 mg via INTRAMUSCULAR

## 2015-10-04 MED ORDER — DIPHENHYDRAMINE HCL 50 MG/ML IJ SOLN
INTRAMUSCULAR | Status: AC
  Filled 2015-10-04: qty 1

## 2015-10-04 NOTE — ED Provider Notes (Signed)
CSN: [id number]     Arrival date & time 10/04/15  1007 History   First MD Initiated Contact with Patient 10/04/15 1108     Chief Complaint  Patient presents with  . Migraine   (Consider location/radiation/quality/duration/timing/severity/associated sxs/prior Treatment) HPI History obtained from patient: Location:  Head Context/Duration: Onset of migraine headache yesterday somewhat worse last night but was able to take Excedrin migraine medication with some relief but awoke this morning and headache was worse. She has had some nausea but no vomiting. Very similar to previous headaches. Usually she states when she gets to this point she will have to have migraine cocktail. She is followed by a local neurologist.  Severity: 7  Quality: Similar to previous headaches Timing:         Constant   Home Treatment: Excedrin Migraine medicine Associated symptoms:  Nausea Family History: Hypertension and diabetes-father    Past Medical History  Diagnosis Date  . Migraine without aura, without mention of intractable migraine without mention of status migrainosus 06/18/2012  . Obesity    Past Surgical History  Procedure Laterality Date  . Wisdom tooth extraction     Family History  Problem Relation Age of Onset  . Hypertension Father   . Diabetes Father   . Cancer Father     colon cancer  . Headache Sister   . Diabetes Sister    Social History  Substance Use Topics  . Smoking status: Never Smoker   . Smokeless tobacco: Never Used  . Alcohol Use: Yes     Comment: 1 weekly   OB History    No data available     Review of Systems  Denies:  NAUSEA, ABDOMINAL PAIN, CHEST PAIN, CONGESTION, DYSURIA, SHORTNESS OF BREATH  Allergies  Codeine and Penicillins cross reactors  Home Medications   Prior to Admission medications   Medication Sig Start Date End Date Taking? Authorizing Provider  aspirin-acetaminophen-caffeine (EXCEDRIN MIGRAINE) [phone] MG per tablet Take 1 tablet by mouth  every 6 (six) hours as needed for pain.   Yes Historical Provider, MD  ketorolac (TORADOL) 10 MG tablet Take 1 tablet (10 mg total) by mouth every 6 (six) hours as needed. 04/05/15   Konrad Felix, PA  meloxicam (MOBIC) 7.5 MG tablet Take 1 tablet (7.5 mg total) by mouth daily. 10/23/14   Araceli Bouche, PA  traZODone (DESYREL) 50 MG tablet Take 0.5 tablets (25 mg total) by mouth at bedtime. 04/05/15   Konrad Felix, PA  traZODone (DESYREL) 50 MG tablet Take 1 tablet (50 mg total) by mouth at bedtime. 04/05/15   Konrad Felix, PA   Meds Ordered and Administered this Visit   Medications  diphenhydrAMINE (BENADRYL) injection 25 mg (25 mg Intramuscular Given 10/04/15 1142)  dexamethasone (DECADRON) injection 10 mg (10 mg Intramuscular Given 10/04/15 1142)  ketorolac (TORADOL) 30 MG/ML injection 30 mg (30 mg Intramuscular Given 10/04/15 1142)    BP 127/67 mmHg  Pulse 89  Temp(Src) 98 F (36.7 C) (Oral)  Resp 12  SpO2 99% No data found.   Physical Exam NURSES NOTES AND VITAL SIGNS REVIEWED. CONSTITUTIONAL: Well developed, well nourished, no acute distress HEENT: normocephalic, atraumatic EYES: Conjunctiva normal NECK:normal ROM, supple, no adenopathy PULMONARY:No respiratory distress, normal effort ABDOMINAL: Soft, ND, NT BS+, No CVAT MUSCULOSKELETAL: Normal ROM of all extremities,  SKIN: warm and dry without rash PSYCHIATRIC: Mood and affect, behavior are normal  ED Course  Procedures (including critical care time)  Labs Review Labs Reviewed - No  data to display  Imaging Review No results found.   Visual Acuity Review  Right Eye Distance:   Left Eye Distance:   Bilateral Distance:    Right Eye Near:   Left Eye Near:    Bilateral Near:       Headache cocktail is administered to the patient and she states that she does feel better after Benadryl, ketorolac, Decadron.  MDM   1. Migraine without aura and without status migrainosus, not intractable     Patient is  reassured that there are no issues that require transfer to higher level of care at this time or additional tests. Patient is advised to continue home symptomatic treatment. Patient is advised that if there are new or worsening symptoms to attend the emergency department, contact primary care provider, or return to UC. Instructions of care provided discharged home in stable condition.    THIS NOTE WAS GENERATED USING A VOICE RECOGNITION SOFTWARE PROGRAM. ALL REASONABLE EFFORTS  WERE MADE TO PROOFREAD THIS DOCUMENT FOR ACCURACY.  I have verbally reviewed the discharge instructions with the patient. A printed AVS was given to the patient.  All questions were answered prior to discharge.      Konrad Felix, PA 10/04/15 1358

## 2015-10-04 NOTE — Discharge Instructions (Signed)

## 2015-10-04 NOTE — ED Notes (Signed)
Patient's family is driving her home, she had never reaction to migraine cocktail and would like to go home and rest. Patient given s/s of reaction to medications. Patient verbalized understanding.

## 2015-10-04 NOTE — ED Notes (Signed)
Patients reports frontal headache that started 2 days ago with nausea, vomiting, and photosensitivity. Patients reports history of migraine headaches. Patient reports usually migraine cocktails do work for her headaches.

## 2016-01-17 ENCOUNTER — Encounter (HOSPITAL_COMMUNITY): Payer: Self-pay | Admitting: Emergency Medicine

## 2016-01-17 ENCOUNTER — Ambulatory Visit (HOSPITAL_COMMUNITY)
Admission: EM | Admit: 2016-01-17 | Discharge: 2016-01-17 | Disposition: A | Discharge status: Home / Self Care | Payer: 59 | Attending: Family Medicine | Admitting: Family Medicine

## 2016-01-17 DIAGNOSIS — H6982 Other specified disorders of Eustachian tube, left ear: Secondary | ICD-10-CM | POA: Diagnosis not present

## 2016-01-17 MED ORDER — IPRATROPIUM BROMIDE 0.06 % NA SOLN: 2.0000 | Freq: Four times a day (QID) | NASAL | 1 refills | Status: DC

## 2016-01-17 NOTE — ED Provider Notes (Signed)
[facility]    CSN: [id number] Arrival date & time: 01/17/16  1836     History   Chief Complaint No chief complaint on file.   HPI Michelle Thomas is a 40 y.o. female.   The history is provided by the patient.  Otalgia  Location:  Left Behind ear:  No abnormality Quality:  Pressure Severity:  Mild Onset quality:  Gradual Duration:  3 days Timing:  Intermittent Progression:  Waxing and waning Chronicity:  New Relieved by:  None tried Worsened by:  Nothing Ineffective treatments:  None tried Associated symptoms: congestion and rhinorrhea   Associated symptoms: no ear discharge, no fever and no sore throat     Past Medical History:  Diagnosis Date  . Migraine without aura, without mention of intractable migraine without mention of status migrainosus 06/18/2012  . Obesity     Patient Active Problem List   Diagnosis Date Noted  . Migraine without aura, without mention of intractable migraine without mention of status migrainosus 06/18/2012    Past Surgical History:  Procedure Laterality Date  . WISDOM TOOTH EXTRACTION      OB History    No data available       Home Medications    Prior to Admission medications   Medication Sig Start Date End Date Taking? Authorizing Provider  aspirin-acetaminophen-caffeine (EXCEDRIN MIGRAINE) [phone] MG per tablet Take 1 tablet by mouth every 6 (six) hours as needed for pain.    Historical Provider, MD  ketorolac (TORADOL) 10 MG tablet Take 1 tablet (10 mg total) by mouth every 6 (six) hours as needed. 04/05/15   Konrad Felix, PA  meloxicam (MOBIC) 7.5 MG tablet Take 1 tablet (7.5 mg total) by mouth daily. 10/23/14   Araceli Bouche, PA  traZODone (DESYREL) 50 MG tablet Take 0.5 tablets (25 mg total) by mouth at bedtime. 04/05/15   Konrad Felix, PA  traZODone (DESYREL) 50 MG tablet Take 1 tablet (50 mg total) by mouth at bedtime. 04/05/15   Konrad Felix, PA    Family History Family History    Problem Relation Age of Onset  . Hypertension Father   . Diabetes Father   . Cancer Father     colon cancer  . Headache Sister   . Diabetes Sister     Social History Social History  Substance Use Topics  . Smoking status: Never Smoker  . Smokeless tobacco: Never Used  . Alcohol use Yes     Comment: 1 weekly     Allergies   Codeine and Penicillins cross reactors   Review of Systems Review of Systems  Constitutional: Negative.  Negative for fever.  HENT: Positive for congestion, ear pain, postnasal drip and rhinorrhea. Negative for ear discharge and sore throat.   Respiratory: Negative.   Cardiovascular: Negative.   All other systems reviewed and are negative.    Physical Exam Triage Vital Signs ED Triage Vitals [01/17/16 1857]  Enc Vitals Group     BP 124/77     Pulse Rate 81     Resp 18     Temp 98.8 F (37.1 C)     Temp Source Oral     SpO2 100 %     Weight      Height      Head Circumference      Peak Flow      Pain Score      Pain Loc      Pain Edu?  Excl. in [place]?    No data found.   Updated Vital Signs BP 124/77 (BP Location: Left Arm)   Pulse 81   Temp 98.8 F (37.1 C) (Oral)   Resp 18   SpO2 100%   Visual Acuity Right Eye Distance:   Left Eye Distance:   Bilateral Distance:    Right Eye Near:   Left Eye Near:    Bilateral Near:     Physical Exam  Constitutional: She is oriented to person, place, and time. She appears well-developed and well-nourished. No distress.  HENT:  Right Ear: External ear normal.  Left Ear: External ear normal.  Nose: Nose normal.  Mouth/Throat: Oropharynx is clear and moist.  Eyes: Pupils are equal, round, and reactive to light.  Neck: Normal range of motion. Neck supple.  Cardiovascular: Normal rate and regular rhythm.   Pulmonary/Chest: Effort normal and breath sounds normal.  Lymphadenopathy:    She has no cervical adenopathy.  Neurological: She is alert and oriented to person, place, and  time.  Skin: Skin is warm and dry.  Nursing note and vitals reviewed.    UC Treatments / Results  Labs (all labs ordered are listed, but only abnormal results are displayed) Labs Reviewed - No data to display  EKG  EKG Interpretation None       Radiology No results found.  Procedures Procedures (including critical care time)  Medications Ordered in UC Medications - No data to display   Initial Impression / Assessment and Plan / UC Course  I have reviewed the triage vital signs and the nursing notes.  Pertinent labs & imaging results that were available during my care of the patient were reviewed by me and considered in my medical decision making (see chart for details).  Clinical Course      Final Clinical Impressions(s) / UC Diagnoses   Final diagnoses:  None    New Prescriptions New Prescriptions   No medications on file     Billy Fischer, MD 01/17/16 1921

## 2016-01-17 NOTE — Discharge Instructions (Signed)
Drink plenty of fluids as discussed, use medicine as prescribed, and mucinex or delsym for cough. Return or see your doctor if further problems °

## 2016-04-04 ENCOUNTER — Other Ambulatory Visit: Payer: Self-pay | Admitting: Family Medicine

## 2016-04-04 DIAGNOSIS — Z713 Dietary counseling and surveillance: Secondary | ICD-10-CM | POA: Diagnosis not present

## 2016-04-04 DIAGNOSIS — R5383 Other fatigue: Secondary | ICD-10-CM | POA: Diagnosis not present

## 2016-04-04 DIAGNOSIS — E559 Vitamin D deficiency, unspecified: Secondary | ICD-10-CM | POA: Diagnosis not present

## 2016-04-04 DIAGNOSIS — E010 Iodine-deficiency related diffuse (endemic) goiter: Secondary | ICD-10-CM | POA: Diagnosis not present

## 2016-04-16 ENCOUNTER — Other Ambulatory Visit: Payer: 59

## 2016-06-09 ENCOUNTER — Ambulatory Visit (HOSPITAL_COMMUNITY): Admission: EM | Admit: 2016-06-09 | Discharge: 2016-06-09 | Discharge status: Against Medical Advice | Payer: 59

## 2016-06-09 ENCOUNTER — Encounter (HOSPITAL_COMMUNITY): Payer: Self-pay | Admitting: Emergency Medicine

## 2016-06-09 ENCOUNTER — Emergency Department (HOSPITAL_COMMUNITY)
Admission: EM | Admit: 2016-06-09 | Discharge: 2016-06-09 | Disposition: A | Discharge status: Home / Self Care | Payer: 59 | Attending: Emergency Medicine | Admitting: Emergency Medicine

## 2016-06-09 DIAGNOSIS — Y939 Activity, unspecified: Secondary | ICD-10-CM | POA: Diagnosis not present

## 2016-06-09 DIAGNOSIS — B9789 Other viral agents as the cause of diseases classified elsewhere: Secondary | ICD-10-CM

## 2016-06-09 DIAGNOSIS — X58XXXA Exposure to other specified factors, initial encounter: Secondary | ICD-10-CM | POA: Insufficient documentation

## 2016-06-09 DIAGNOSIS — Y999 Unspecified external cause status: Secondary | ICD-10-CM | POA: Diagnosis not present

## 2016-06-09 DIAGNOSIS — S46911A Strain of unspecified muscle, fascia and tendon at shoulder and upper arm level, right arm, initial encounter: Secondary | ICD-10-CM | POA: Insufficient documentation

## 2016-06-09 DIAGNOSIS — J069 Acute upper respiratory infection, unspecified: Secondary | ICD-10-CM | POA: Diagnosis not present

## 2016-06-09 DIAGNOSIS — Y929 Unspecified place or not applicable: Secondary | ICD-10-CM | POA: Insufficient documentation

## 2016-06-09 DIAGNOSIS — Z7982 Long term (current) use of aspirin: Secondary | ICD-10-CM | POA: Diagnosis not present

## 2016-06-09 DIAGNOSIS — R05 Cough: Secondary | ICD-10-CM | POA: Diagnosis not present

## 2016-06-09 DIAGNOSIS — S4991XA Unspecified injury of right shoulder and upper arm, initial encounter: Secondary | ICD-10-CM | POA: Diagnosis present

## 2016-06-09 NOTE — ED Provider Notes (Signed)
[facility] DEPT Provider Note   CSN: [id number] Arrival date & time: 06/09/16  1023   By signing my name below, I, Neta Mends, attest that this documentation has been prepared under the direction and in the presence of Comer Locket, PA-C. Electronically Signed: Neta Mends, ED Scribe. 06/09/2016. 10:52 AM.   History   Chief Complaint Chief Complaint  Patient presents with  . Influenza    The history is provided by the patient. No language interpreter was used.   HPI Comments:  Michelle Thomas is a 41 y.o. female who presents to the Emergency Department complaining of persistent productive cough x 2 days. She states that her sputum is green. Pt complains of associated sore throat, generalized body aches, chills, nausea. She notes that she fells like she pulled a muscle in her right shoulderFrom coughing, and denies injury/trauma. Pt did get a flu shot this year. Pt has taken Dayquil with no relief. Pt denies rhinorrhea, abdominal pain, vomiting, fever. No other remedies tried. Reports she has not been drinking very much fluids. No other medical problems.  Past Medical History:  Diagnosis Date  . Migraine without aura, without mention of intractable migraine without mention of status migrainosus 06/18/2012  . Obesity     Patient Active Problem List   Diagnosis Date Noted  . Migraine without aura, without mention of intractable migraine without mention of status migrainosus 06/18/2012    Past Surgical History:  Procedure Laterality Date  . WISDOM TOOTH EXTRACTION      OB History    No data available       Home Medications    Prior to Admission medications   Medication Sig Start Date End Date Taking? Authorizing Provider  aspirin-acetaminophen-caffeine (EXCEDRIN MIGRAINE) [phone] MG per tablet Take 1 tablet by mouth every 6 (six) hours as needed for pain.    Historical Provider, MD  ibuprofen (ADVIL,MOTRIN) 200 MG tablet Take 200 mg by  mouth every 6 (six) hours as needed.    Historical Provider, MD  ipratropium (ATROVENT) 0.06 % nasal spray Place 2 sprays into both nostrils 4 (four) times daily. 01/17/16   Billy Fischer, MD  ketorolac (TORADOL) 10 MG tablet Take 1 tablet (10 mg total) by mouth every 6 (six) hours as needed. 04/05/15   Konrad Felix, PA  meloxicam (MOBIC) 7.5 MG tablet Take 1 tablet (7.5 mg total) by mouth daily. 10/23/14   Araceli Bouche, PA  traZODone (DESYREL) 50 MG tablet Take 0.5 tablets (25 mg total) by mouth at bedtime. 04/05/15   Konrad Felix, PA  traZODone (DESYREL) 50 MG tablet Take 1 tablet (50 mg total) by mouth at bedtime. 04/05/15   Konrad Felix, PA    Family History Family History  Problem Relation Age of Onset  . Hypertension Father   . Diabetes Father   . Cancer Father     colon cancer  . Headache Sister   . Diabetes Sister     Social History Social History  Substance Use Topics  . Smoking status: Never Smoker  . Smokeless tobacco: Never Used  . Alcohol use Yes     Comment: 1 weekly     Allergies   Codeine and Penicillins cross reactors   Review of Systems Review of Systems 10 Systems reviewed and are negative for acute change except as noted in the HPI.   Physical Exam Updated Vital Signs BP 125/88 (BP Location: Left Arm)   Pulse 106   Temp 98.7 F (  37.1 C) (Oral)   Resp 18   Ht 5\' 9"  (1.753 m)   Wt 127 kg   LMP 06/04/2016   SpO2 100%   BMI 41.35 kg/m   Physical Exam  Constitutional: She appears well-developed and well-nourished. No distress.  Awake, alert and nontoxic in appearance  HENT:  Head: Normocephalic and atraumatic.  Right Ear: External ear normal.  Left Ear: External ear normal.  Mouth/Throat: Oropharynx is clear and moist.  Eyes: Conjunctivae and EOM are normal. Pupils are equal, round, and reactive to light.  Neck: Normal range of motion. No JVD present.  Cardiovascular: Normal rate, regular rhythm and normal heart sounds.     Pulmonary/Chest: Effort normal and breath sounds normal. No stridor.  Abdominal: Soft. She exhibits no distension. There is no tenderness.  Musculoskeletal: Normal range of motion.  Minimal tenderness diffusely about the posterior right shoulder musculature. Maintains good range of motion.  Neurological: She is alert.  Awake, alert, cooperative and aware of situation; no facial asymmetry; tongue midline; major cranial nerves appear intact;  baseline gait without new ataxia.  Skin: Skin is warm and dry. No rash noted. She is not diaphoretic.  Psychiatric: She has a normal mood and affect. Her behavior is normal. Thought content normal.  Nursing note and vitals reviewed.  Vitals:   06/09/16 1030  BP: 125/88  Pulse: 106  Resp: 18  Temp: 98.7 F (37.1 C)  TempSrc: Oral  SpO2: 100%  Weight: 127 kg  Height: 5\' 9"  (1.753 m)     ED Treatments / Results  DIAGNOSTIC STUDIES:  Oxygen Saturation is 100% on RA, normal by my interpretation.    COORDINATION OF CARE:  10:49 AM Discussed treatment plan with pt at bedside and pt agreed to plan.   Labs (all labs ordered are listed, but only abnormal results are displayed) Labs Reviewed - No data to display  EKG  EKG Interpretation None       Radiology No results found.  Procedures Procedures (including critical care time)  Medications Ordered in ED Medications - No data to display   Initial Impression / Assessment and Plan / ED Course  Pt symptoms consistent with Likely viral URI. Overall appears well. Pt will be discharged with instructions for symptomatic treatment. Push fluids. Discussed return precautions.  Pt is hemodynamically stable & in NAD prior to discharge.  I have reviewed the triage vital signs and the nursing notes.  Pertinent labs & imaging results that were available during my care of the patient were reviewed by me and considered in my medical decision making (see chart for details).       Final  Clinical Impressions(s) / ED Diagnoses   Final diagnoses:  Viral URI with cough  Muscle strain of right shoulder region, initial encounter    New Prescriptions New Prescriptions   No medications on file  I personally performed the services described in this documentation, which was scribed in my presence. The recorded information has been reviewed and is accurate.     Comer Locket, PA-C 06/09/16 [place], MD 06/09/16 2049

## 2016-06-09 NOTE — ED Triage Notes (Signed)
Pt reports flu like symptoms, to include productive cough, sore throat and body aches x2 days. Pt also reports she feels she may have a pulled muscle in her right shoulder, denies injury.

## 2016-06-09 NOTE — Discharge Instructions (Signed)
Your symptoms are likely due to a virus and will resolve on their own. You may consider taking Mucinex fast max or other OTC medicine to help with your symptoms. Consider ibuprofen 600 mg 4 times a day as needed to help with body aches, strained shoulder. It is also important to stay well-hydrated and drink lots of water, at least 8, 8 ounce glasses per day. Follow-up with your doctor next week as needed. Return to ED for any new or worsening symptoms as we discussed.

## 2016-06-11 ENCOUNTER — Ambulatory Visit (INDEPENDENT_AMBULATORY_CARE_PROVIDER_SITE_OTHER): Payer: 59 | Admitting: Family Medicine

## 2016-06-11 VITALS — BP 122/84 | HR 102 | Temp 98.7°F | Resp 18 | Ht 69.0 in | Wt 284.0 lb

## 2016-06-11 DIAGNOSIS — J209 Acute bronchitis, unspecified: Secondary | ICD-10-CM

## 2016-06-11 DIAGNOSIS — R51 Headache: Secondary | ICD-10-CM | POA: Diagnosis not present

## 2016-06-11 DIAGNOSIS — H6593 Unspecified nonsuppurative otitis media, bilateral: Secondary | ICD-10-CM

## 2016-06-11 DIAGNOSIS — R519 Headache, unspecified: Secondary | ICD-10-CM

## 2016-06-11 MED ORDER — AZITHROMYCIN 250 MG PO TABS: ORAL_TABLET | ORAL | 0 refills | Status: DC

## 2016-06-11 MED ORDER — BENZONATATE 100 MG PO CAPS: 100.0000 mg | ORAL_CAPSULE | Freq: Three times a day (TID) | ORAL | 0 refills | Status: DC | PRN

## 2016-06-11 MED ORDER — NAPROXEN 500 MG PO TABS: 500.0000 mg | ORAL_TABLET | Freq: Two times a day (BID) | ORAL | 0 refills | Status: DC

## 2016-06-11 NOTE — Patient Instructions (Addendum)
Start Azithromycin Take 2 tabs x 1 dose, then 1 tab every day for x 4 days.  Take Benzonatate 100-200 mg up to 3 times daily as needed for cough.  Purchase over the counter Delsym 5 ml every 12 hours as needed for cough.  Take 500 Naproxen up to twice daily with food as needed for headache.   IF you received an x-ray today, you will receive an invoice from [facility]. Please contact [facility] at [phone] with questions or concerns regarding your invoice.   IF you received labwork today, you will receive an invoice from [facility]. Please contact [facility] at [phone] with questions or concerns regarding your invoice.   Our billing staff will not be able to assist you with questions regarding bills from these companies.  You will be contacted with the lab results as soon as they are available. The fastest way to get your results is to activate your My Chart account. Instructions are located on the last page of this paperwork. If you have not heard from [place] regarding the results in 2 weeks, please contact this office.     Acute Bronchitis, Adult Acute bronchitis is when air tubes (bronchi) in the lungs suddenly get swollen. The condition can make it hard to breathe. It can also cause these symptoms:  A cough.  Coughing up clear, yellow, or green mucus.  Wheezing.  Chest congestion.  Shortness of breath.  A fever.  Body aches.  Chills.  A sore throat. Follow these instructions at home: Medicines   Take over-the-counter and prescription medicines only as told by your doctor.  If you were prescribed an antibiotic medicine, take it as told by your doctor. Do not stop taking the antibiotic even if you start to feel better. General instructions   Rest.  Drink enough fluids to keep your pee (urine) clear or pale yellow.  Avoid smoking and secondhand smoke. If you smoke and you need help quitting, ask your doctor. Quitting will help your lungs heal  faster.  Use an inhaler, cool mist vaporizer, or humidifier as told by your doctor.  Keep all follow-up visits as told by your doctor. This is important. How is this prevented? To lower your risk of getting this condition again:  Wash your hands often with soap and water. If you cannot use soap and water, use hand sanitizer.  Avoid contact with people who have cold symptoms.  Try not to touch your hands to your mouth, nose, or eyes.  Make sure to get the flu shot every year. Contact a doctor if:  Your symptoms do not get better in 2 weeks. Get help right away if:  You cough up blood.  You have chest pain.  You have very bad shortness of breath.  You become dehydrated.  You faint (pass out) or keep feeling like you are going to pass out.  You keep throwing up (vomiting).  You have a very bad headache.  Your fever or chills gets worse. This information is not intended to replace advice given to you by your health care provider. Make sure you discuss any questions you have with your health care provider. Document Released: 09/03/2007 Document Revised: 10/24/2015 Document Reviewed: 09/05/2015 Elsevier Interactive Patient Education  2017 [employer].

## 2016-06-11 NOTE — Progress Notes (Signed)
Patient ID: Michelle Thomas, female    DOB: March 27, 1976, 41 y.o.   MRN: [id number]  PCP: Vena Austria, MD  Chief Complaint  Patient presents with  . Influenza    x 6 days went to the ED and wasn't tested for flu or strep  . Cough  . Sore Throat  . Generalized Body Aches  . Fever    highest was 100.2 yesterday     Subjective:  HPI [name], 41 year old female presents for evaluation of influenza like symptoms, cough, sore throat, generalized body aches x 6 days, and 1 day of fever.  She reports illness asarted with sore throat which progressed to generalized body aches, cough now sometimes productive although she reports is more persistent now.  Complains of shortness of breath without wheezing. 1 day fever hx TMAX 100.2 F.  Taking Mucinex, Motrin, and Nyquil. Headache persisted over the last few days and unilateral on the right side.  Social History   Social History  . Marital status: Single    Spouse name: N/A  . Number of children: 0  . Years of education: BA   Occupational History  . Clinical research associate of [employer]   Social History Main Topics  . Smoking status: Never Smoker  . Smokeless tobacco: Never Used  . Alcohol use Yes     Comment: 1 weekly  . Drug use: No  . Sexual activity: No   Other Topics Concern  . Not on file   Social History Narrative  . No narrative on file    Family History  Problem Relation Age of Onset  . Hypertension Father   . Diabetes Father   . Cancer Father     colon cancer  . Headache Sister   . Diabetes Sister    Review of Systems  See HPI  Patient Active Problem List   Diagnosis Date Noted  . Migraine without aura, without mention of intractable migraine without mention of status migrainosus 06/18/2012    Allergies  Allergen Reactions  . Codeine     Rash  . Penicillins Cross Reactors Hives    Prior to Admission medications   Medication Sig Start Date End Date Taking? Authorizing  Provider  aspirin-acetaminophen-caffeine (EXCEDRIN MIGRAINE) [phone] MG per tablet Take 1 tablet by mouth every 6 (six) hours as needed for pain.   Yes Historical Provider, MD  ibuprofen (ADVIL,MOTRIN) 200 MG tablet Take 200 mg by mouth every 6 (six) hours as needed.   Yes Historical Provider, MD  ipratropium (ATROVENT) 0.06 % nasal spray Place 2 sprays into both nostrils 4 (four) times daily. Patient not taking: Reported on 06/11/2016 01/17/16   Billy Fischer, MD  ketorolac (TORADOL) 10 MG tablet Take 1 tablet (10 mg total) by mouth every 6 (six) hours as needed. Patient not taking: Reported on 06/11/2016 04/05/15   Konrad Felix, PA  meloxicam (MOBIC) 7.5 MG tablet Take 1 tablet (7.5 mg total) by mouth daily. Patient not taking: Reported on 06/11/2016 10/23/14   Araceli Bouche, PA  traZODone (DESYREL) 50 MG tablet Take 0.5 tablets (25 mg total) by mouth at bedtime. Patient not taking: Reported on 06/11/2016 04/05/15   Konrad Felix, PA  traZODone (DESYREL) 50 MG tablet Take 1 tablet (50 mg total) by mouth at bedtime. Patient not taking: Reported on 06/11/2016 04/05/15   Konrad Felix, PA    Past Medical, Surgical Family and Social History reviewed and updated.    Objective:   Today's  Vitals   06/11/16 0856  BP: 122/84  Pulse: (!) 102  Resp: 18  Temp: 98.7 F (37.1 C)  TempSrc: Oral  SpO2: 98%  Weight: 284 lb (128.8 kg)  Height: 5\' 9"  (1.753 m)    Wt Readings from Last 3 Encounters:  06/11/16 284 lb (128.8 kg)  06/09/16 280 lb (127 kg)  10/23/14 277 lb 2 oz (125.7 kg)   Physical Exam  Constitutional: She is oriented to person, place, and time. She appears well-developed and well-nourished.  HENT:  Head: Normocephalic and atraumatic.  Right Ear: Tympanic membrane and external ear normal.  Left Ear: Hearing, tympanic membrane, external ear and ear canal normal.  Nose: Mucosal edema and rhinorrhea present.  Mouth/Throat: Uvula is midline. No oropharyngeal exudate or posterior  oropharyngeal edema.  Eyes: Pupils are equal, round, and reactive to light.  Cardiovascular: Normal heart sounds and intact distal pulses.   No murmur heard. Pulmonary/Chest: Effort normal and breath sounds normal. No respiratory distress. She has no wheezes. She exhibits no tenderness.  Neurological: She is alert and oriented to person, place, and time.  Skin: Skin is warm and dry.  Psychiatric: She has a normal mood and affect. Her behavior is normal. Judgment and thought content normal.     Assessment & Plan:  1. Acute bronchitis, unspecified organism 2. Sinus headache 3. Middle ear effusion, bilateral  Plan:  Start Azithromycin Take 2 tabs x 1 dose, then 1 tab every day for x 4 days.  Take Benzonatate 100-200 mg up to 3 times daily as needed for cough.  Delsym 5 ml every 12 hours as needed for cough.  Take 500 Naproxen up to twice daily with food as needed for headache.   Carroll Sage. Kenton Kingfisher, MSN, FNP-C [facility] [place]

## 2016-06-18 DIAGNOSIS — Z01419 Encounter for gynecological examination (general) (routine) without abnormal findings: Secondary | ICD-10-CM | POA: Diagnosis not present

## 2016-06-30 DIAGNOSIS — E559 Vitamin D deficiency, unspecified: Secondary | ICD-10-CM | POA: Diagnosis not present

## 2016-07-07 DIAGNOSIS — Z1231 Encounter for screening mammogram for malignant neoplasm of breast: Secondary | ICD-10-CM | POA: Diagnosis not present

## 2016-08-20 DIAGNOSIS — Z0000 Encounter for general adult medical examination without abnormal findings: Secondary | ICD-10-CM | POA: Diagnosis not present

## 2016-08-20 DIAGNOSIS — E785 Hyperlipidemia, unspecified: Secondary | ICD-10-CM | POA: Diagnosis not present

## 2016-11-16 ENCOUNTER — Encounter (HOSPITAL_COMMUNITY): Payer: Self-pay | Admitting: Emergency Medicine

## 2016-11-16 ENCOUNTER — Ambulatory Visit (HOSPITAL_COMMUNITY)
Admission: EM | Admit: 2016-11-16 | Discharge: 2016-11-16 | Disposition: A | Discharge status: Home / Self Care | Payer: 59 | Attending: Family Medicine | Admitting: Family Medicine

## 2016-11-16 DIAGNOSIS — M779 Enthesopathy, unspecified: Principal | ICD-10-CM

## 2016-11-16 DIAGNOSIS — M778 Other enthesopathies, not elsewhere classified: Secondary | ICD-10-CM

## 2016-11-16 DIAGNOSIS — M7750 Other enthesopathy of unspecified foot: Secondary | ICD-10-CM

## 2016-11-16 MED ORDER — PREDNISONE 20 MG PO TABS: ORAL_TABLET | ORAL | 0 refills | Status: DC

## 2016-11-16 NOTE — ED Triage Notes (Signed)
2 weeks ago noticed minimal foot pain ( while on vacation).  Recently pain has worsened.  Pain in top of foot with swelling.

## 2016-11-16 NOTE — ED Provider Notes (Signed)
[place]    CSN: [id number] Arrival date & time: 11/16/16  1532     History   Chief Complaint Chief Complaint  Patient presents with  . Foot Pain    HPI Michelle Thomas is a 41 y.o. female.   2 weeks ago noticed minimal foot pain ( while on vacation).  Recently pain has worsened.  Pain in top of foot with swelling.  No impact trauma. Patient was walking more than usual on vacation.  Patient works in the emergency department in registration.  Patient notes increasing pain when she lays her she was tight. The pain was particularly bad last night but is eased off this morning.  Patient has tried some Tylenol which has not none much.      Past Medical History:  Diagnosis Date  . Migraine without aura, without mention of intractable migraine without mention of status migrainosus 06/18/2012  . Obesity     Patient Active Problem List   Diagnosis Date Noted  . Migraine without aura, without mention of intractable migraine without mention of status migrainosus 06/18/2012    Past Surgical History:  Procedure Laterality Date  . WISDOM TOOTH EXTRACTION      OB History    No data available       Home Medications    Prior to Admission medications   Medication Sig Start Date End Date Taking? Authorizing Provider  aspirin-acetaminophen-caffeine (EXCEDRIN MIGRAINE) [phone] MG per tablet Take 1 tablet by mouth every 6 (six) hours as needed for pain.    [provider]  ibuprofen (ADVIL,MOTRIN) 200 MG tablet Take 200 mg by mouth every 6 (six) hours as needed.    [provider]  naproxen (NAPROSYN) 500 MG tablet Take 1 tablet (500 mg total) by mouth 2 (two) times daily with a meal. 06/11/16   Scot Jun, FNP  predniSONE (DELTASONE) 20 MG tablet one daily with food 11/16/16   Robyn Haber, MD    Family History Family History  Problem Relation Age of Onset  . Hypertension Father   . Diabetes Father   . Cancer Father          colon cancer  . Headache Sister   . Diabetes Sister     Social History Social History  Substance Use Topics  . Smoking status: Never Smoker  . Smokeless tobacco: Never Used  . Alcohol use Yes     Comment: 1 weekly     Allergies   Codeine and Penicillins cross reactors   Review of Systems Review of Systems  Musculoskeletal: Positive for gait problem.  All other systems reviewed and are negative.    Physical Exam Triage Vital Signs ED Triage Vitals  Enc Vitals Group     BP 11/16/16 1544 133/85     Pulse Rate 11/16/16 1544 92     Resp 11/16/16 1544 18     Temp 11/16/16 1544 98.5 F (36.9 C)     Temp Source 11/16/16 1544 Oral     SpO2 11/16/16 1544 98 %     Weight --      Height --      Head Circumference --      Peak Flow --      Pain Score 11/16/16 1542 5     Pain Loc --      Pain Edu? --      Excl. in [place]? --    No data found.   Updated Vital Signs BP 133/85 (BP Location:  Right Arm)   Pulse 92   Temp 98.5 F (36.9 C) (Oral)   Resp 18   SpO2 98%   Visual Acuity Right Eye Distance:   Left Eye Distance:   Bilateral Distance:    Right Eye Near:   Left Eye Near:    Bilateral Near:     Physical Exam  Constitutional: She is oriented to person, place, and time. She appears well-developed and well-nourished.  HENT:  Right Ear: External ear normal.  Left Ear: External ear normal.  Eyes: Conjunctivae are normal.  Neck: Normal range of motion. Neck supple.  Pulmonary/Chest: Effort normal.  Musculoskeletal: Normal range of motion. She exhibits tenderness.  Mildly tender dorsal left foot Pain in foot is reproducible patient hyper dorsiflexes her left foot.  There is good pulse in left dorsalis pedal pulse  Neurological: She is alert and oriented to person, place, and time.  Skin: Skin is warm and dry.  Nursing note and vitals reviewed.    UC Treatments / Results  Labs (all labs ordered are listed, but only abnormal results are  displayed) Labs Reviewed - No data to display  EKG  EKG Interpretation None       Radiology No results found.  Procedures Procedures (including critical care time)  Medications Ordered in UC Medications - No data to display   Initial Impression / Assessment and Plan / UC Course  I have reviewed the triage vital signs and the nursing notes.  Pertinent labs & imaging results that were available during my care of the patient were reviewed by me and considered in my medical decision making (see chart for details).     Final Clinical Impressions(s) / UC Diagnoses   Final diagnoses:  Extensor tendonitis of foot    New Prescriptions New Prescriptions   PREDNISONE (DELTASONE) 20 MG TABLET    one daily with food     Controlled Substance Prescriptions [place] Controlled Substance Registry consulted? Not Applicable   Robyn Haber, MD 11/16/16 [phone]

## 2016-11-16 NOTE — Discharge Instructions (Signed)
Expect improvement in 1-2 days.

## 2017-01-02 ENCOUNTER — Other Ambulatory Visit: Payer: Self-pay | Admitting: Family Medicine

## 2017-01-02 DIAGNOSIS — E559 Vitamin D deficiency, unspecified: Secondary | ICD-10-CM | POA: Diagnosis not present

## 2017-01-02 DIAGNOSIS — E010 Iodine-deficiency related diffuse (endemic) goiter: Secondary | ICD-10-CM | POA: Diagnosis not present

## 2017-01-02 DIAGNOSIS — R2242 Localized swelling, mass and lump, left lower limb: Secondary | ICD-10-CM

## 2017-01-02 DIAGNOSIS — R5383 Other fatigue: Secondary | ICD-10-CM | POA: Diagnosis not present

## 2017-01-08 DIAGNOSIS — D508 Other iron deficiency anemias: Secondary | ICD-10-CM | POA: Diagnosis not present

## 2017-01-09 ENCOUNTER — Ambulatory Visit
Admission: RE | Admit: 2017-01-09 | Discharge: 2017-01-09 | Disposition: A | Discharge status: Home / Self Care | Payer: 59 | Source: Ambulatory Visit | Attending: Family Medicine | Admitting: Family Medicine

## 2017-01-09 DIAGNOSIS — E042 Nontoxic multinodular goiter: Secondary | ICD-10-CM | POA: Diagnosis not present

## 2017-01-09 DIAGNOSIS — R2242 Localized swelling, mass and lump, left lower limb: Secondary | ICD-10-CM | POA: Diagnosis not present

## 2017-01-09 DIAGNOSIS — E010 Iodine-deficiency related diffuse (endemic) goiter: Secondary | ICD-10-CM

## 2017-02-10 ENCOUNTER — Other Ambulatory Visit: Payer: Self-pay

## 2017-02-10 ENCOUNTER — Emergency Department (HOSPITAL_BASED_OUTPATIENT_CLINIC_OR_DEPARTMENT_OTHER): Payer: 59

## 2017-02-10 ENCOUNTER — Encounter (HOSPITAL_BASED_OUTPATIENT_CLINIC_OR_DEPARTMENT_OTHER): Payer: Self-pay | Admitting: Emergency Medicine

## 2017-02-10 ENCOUNTER — Emergency Department (HOSPITAL_BASED_OUTPATIENT_CLINIC_OR_DEPARTMENT_OTHER)
Admission: EM | Admit: 2017-02-10 | Discharge: 2017-02-10 | Disposition: A | Discharge status: Home / Self Care | Payer: 59 | Attending: Physician Assistant | Admitting: Physician Assistant

## 2017-02-10 DIAGNOSIS — Z79899 Other long term (current) drug therapy: Secondary | ICD-10-CM | POA: Diagnosis not present

## 2017-02-10 DIAGNOSIS — M25552 Pain in left hip: Secondary | ICD-10-CM

## 2017-02-10 MED ORDER — KETOROLAC TROMETHAMINE 30 MG/ML IJ SOLN
30.0000 mg | Freq: Once | INTRAMUSCULAR | Status: AC
  Administered 2017-02-10: 30 mg via INTRAMUSCULAR
  Filled 2017-02-10: qty 1

## 2017-02-10 MED ORDER — NAPROXEN 500 MG PO TABS: 500.0000 mg | ORAL_TABLET | Freq: Two times a day (BID) | ORAL | 0 refills | Status: DC

## 2017-02-10 MED FILL — NAPROXEN 500 MG TABLET: 500 | 15 days supply | Qty: 30 | Fill #0

## 2017-02-10 NOTE — ED Triage Notes (Signed)
L hip pain since Saturday. No known injury. States pain is much worse today, difficult to walk. Pt also reports nausea secondary to pain.

## 2017-02-10 NOTE — ED Provider Notes (Signed)
[facility] EMERGENCY DEPARTMENT Provider Note   CSN: [id number] Arrival date & time: 02/10/17  1021     History   Chief Complaint Chief Complaint  Patient presents with  . Hip Pain    HPI  Michelle Thomas is a 41 y.o. Female presents with left hip pain since Saturday.  Patient denies any inciting fall or injury.  But states over the weekend pain has been getting progressively worse and today has been difficult to walk because of the pain.  Patient reports pain so bad she has been nauseated.  Patient locates pain over the bony prominence of the hip, that radiates around into the groin and across the buttock.  She denies any redness, bruising or swelling of the hip or distal leg.  Denies any numbness or tingling.  Reports she is tried Tylenol at home for the pain with no improvement.  Denies any pain in the low back, no pain in the right hip, no abdominal pain or dysuria.      Past Medical History:  Diagnosis Date  . Migraine without aura, without mention of intractable migraine without mention of status migrainosus 06/18/2012  . Obesity     Patient Active Problem List   Diagnosis Date Noted  . Migraine without aura, without mention of intractable migraine without mention of status migrainosus 06/18/2012    Past Surgical History:  Procedure Laterality Date  . WISDOM TOOTH EXTRACTION      OB History    No data available       Home Medications    Prior to Admission medications   Medication Sig Start Date End Date Taking? Authorizing Provider  aspirin-acetaminophen-caffeine (EXCEDRIN MIGRAINE) [phone] MG per tablet Take 1 tablet by mouth every 6 (six) hours as needed for pain.    [provider]  ibuprofen (ADVIL,MOTRIN) 200 MG tablet Take 200 mg by mouth every 6 (six) hours as needed.    [provider]  naproxen (NAPROSYN) 500 MG tablet Take 1 tablet (500 mg total) by mouth 2 (two) times daily with a meal. 06/11/16   Scot Jun, FNP  predniSONE (DELTASONE) 20 MG tablet one daily with food 11/16/16   Robyn Haber, MD    Family History Family History  Problem Relation Age of Onset  . Hypertension Father   . Diabetes Father   . Cancer Father        colon cancer  . Headache Sister   . Diabetes Sister     Social History Social History   Tobacco Use  . Smoking status: Never Smoker  . Smokeless tobacco: Never Used  Substance Use Topics  . Alcohol use: Yes    Comment: 1 weekly  . Drug use: No     Allergies   Codeine and Penicillins cross reactors   Review of Systems Review of Systems  Constitutional: Negative for chills and fever.  Gastrointestinal: Negative for abdominal pain.  Genitourinary: Negative for dysuria.  Musculoskeletal: Positive for arthralgias (L hip), gait problem and myalgias. Negative for back pain, joint swelling and neck pain.  Skin: Negative for color change, rash and wound.  Neurological: Negative for weakness and numbness.     Physical Exam Updated Vital Signs BP (!) 148/97 (BP Location: Left Arm)   Pulse 92   Temp 98.9 F (37.2 C) (Oral)   Resp 18   LMP 02/10/2017   SpO2 99%   Physical Exam  Constitutional: She appears well-developed and well-nourished. No distress.  HENT:  Head:  Normocephalic and atraumatic.  Eyes: Right eye exhibits no discharge. Left eye exhibits no discharge.  Pulmonary/Chest: Effort normal. No respiratory distress.  Abdominal: Soft. Bowel sounds are normal. She exhibits no distension. There is no tenderness. There is no guarding.  Musculoskeletal:  Tenderness over the bony prominence of the left greater trochanter extending over the upper anterior thigh.  No ecchymosis, swelling or erythema.  Patient able to flex and abduction, but range of motion limited by pain.  Patient able to bear weight with.  No swelling or tenderness of the distal left leg, no edema, normal knee  and ankle exam, 2+ DP and TP pulses, sensation intact, 5/5  dorsiflexion plantarflexion, nontender right hip  Neurological: She is alert. Coordination normal.  Skin: Skin is warm and dry. Capillary refill takes less than 2 seconds. No rash noted. She is not diaphoretic. No erythema.  Psychiatric: She has a normal mood and affect. Her behavior is normal.  Nursing note and vitals reviewed.    ED Treatments / Results  Labs (all labs ordered are listed, but only abnormal results are displayed) Labs Reviewed - No data to display  EKG  EKG Interpretation None       Radiology No results found.  Procedures Procedures (including critical care time)  Medications Ordered in ED Medications - No data to display   Initial Impression / Assessment and Plan / ED Course  I have reviewed the triage vital signs and the nursing notes.  Pertinent labs & imaging results that were available during my care of the patient were reviewed by me and considered in my medical decision making (see chart for details).  Patient presents with left hip pain, no injury or fall, tenderness over the greater trochanter extending across the anterior thigh, no swelling or erythema, no concerning for septic joint.  Left leg is neurovascularly intact.  Presentation suggestive of pedis or muscle strain.  X-ray negative for fracture or dislocation.  Will treat with NSAIDs and rice therapy.  Reevaluate after pain medication in the ED.   On reevaluation pain is much improved, patient able to ambulate in the hallway without difficulties.  She is stable for discharge home with NSAID therapy, crutches for comfort, patient encouraged to rest and keep leg elevated for the next few days.  If symptoms not improving after 1 week patient to follow-up with Karlton Lemon with sports medicine.  Return precautions discussed.  Patient expresses understanding and agrees with plan.  Final Clinical Impressions(s) / ED Diagnoses   Final diagnoses:  Left hip pain    ED Discharge Orders         Ordered    naproxen (NAPROSYN) 500 MG tablet  2 times daily     02/10/17 1316       Janet Berlin 02/10/17 2034    Macarthur Critchley, MD 02/12/17 1514

## 2017-02-10 NOTE — Discharge Instructions (Signed)
Naprosyn for pain.  Rest and keep leg elevated as much as possible, you may alternate ice and heat.  If symptoms not improving after 1 week of this conservative therapy please follow-up with Dr. Karlton Lemon with sports medicine.  If you develop swelling, redness, warmth or significantly worsens pain, fevers or chills or other concerning symptoms please return to the ED for sooner evaluation.

## 2017-04-03 DIAGNOSIS — E559 Vitamin D deficiency, unspecified: Secondary | ICD-10-CM | POA: Diagnosis not present

## 2017-04-03 DIAGNOSIS — D508 Other iron deficiency anemias: Secondary | ICD-10-CM | POA: Diagnosis not present

## 2017-04-03 DIAGNOSIS — R2242 Localized swelling, mass and lump, left lower limb: Secondary | ICD-10-CM | POA: Diagnosis not present

## 2017-04-03 DIAGNOSIS — E010 Iodine-deficiency related diffuse (endemic) goiter: Secondary | ICD-10-CM | POA: Diagnosis not present

## 2017-06-04 DIAGNOSIS — L02214 Cutaneous abscess of groin: Secondary | ICD-10-CM | POA: Diagnosis not present

## 2017-07-09 DIAGNOSIS — Z6841 Body Mass Index (BMI) 40.0 and over, adult: Secondary | ICD-10-CM | POA: Diagnosis not present

## 2017-07-09 DIAGNOSIS — Z01419 Encounter for gynecological examination (general) (routine) without abnormal findings: Secondary | ICD-10-CM | POA: Diagnosis not present

## 2017-07-15 ENCOUNTER — Encounter (HOSPITAL_COMMUNITY): Payer: Self-pay | Admitting: Emergency Medicine

## 2017-07-15 ENCOUNTER — Ambulatory Visit (HOSPITAL_COMMUNITY)
Admission: EM | Admit: 2017-07-15 | Discharge: 2017-07-15 | Disposition: A | Discharge status: Home / Self Care | Payer: 59 | Attending: Family Medicine | Admitting: Family Medicine

## 2017-07-15 DIAGNOSIS — J029 Acute pharyngitis, unspecified: Secondary | ICD-10-CM

## 2017-07-15 LAB — POCT RAPID STREP A: Streptococcus, Group A Screen (Direct): NEGATIVE

## 2017-07-15 MED ORDER — CHLORHEXIDINE GLUCONATE 0.12 % MT SOLN: 15.0000 mL | Freq: Two times a day (BID) | OROMUCOSAL | 0 refills | Status: DC

## 2017-07-15 NOTE — ED Provider Notes (Signed)
[facility]   [id number] 07/15/17 Arrival Time: 1130   SUBJECTIVE:  Michelle Thomas is a 42 y.o. female who presents to the urgent care with complaint of sore throat   [place]   [id number] 07/15/17 Arrival Time: 1130   SUBJECTIVE:  Michelle Thomas is a 42 y.o. female who presents to the urgent care with complaint of sore throat  No fever or cough  Took OTC medicine for allergy which did not help  Works in [employer] for [employer] and at the hospital   Past Medical History:  Diagnosis Date  . Migraine without aura, without mention of intractable migraine without mention of status migrainosus 06/18/2012  . Obesity    Family History  Problem Relation Age of Onset  . Hypertension Father   . Diabetes Father   . Cancer Father        colon cancer  . Headache Sister   . Diabetes Sister    Social History   Socioeconomic History  . Marital status: Single    Spouse name: Not on file  . Number of children: 0  . Years of education: BA  . Highest education level: Not on file  Occupational History  . Occupation: Doctor, hospital: [employer]  . Financial resource strain: Not on file  . Food insecurity:    Worry: Not on file    Inability: Not on file  . Transportation needs:    Medical: Not on file    Non-medical: Not on file  Tobacco Use  . Smoking status: Never Smoker  . Smokeless tobacco: Never Used  Substance and Sexual Activity  . Alcohol use: Yes    Comment: 1 weekly  . Drug use: No  . Sexual activity: Never    Birth control/protection: Condom  Lifestyle  . Physical activity:    Days per week: Not on file    Minutes per session: Not on file  . Stress: Not on file  Relationships  . Social connections:    Talks on phone: Not on file    Gets together: Not on file    Attends religious service: Not on file    Active member of club or organization: Not on file   Attends meetings of clubs or organizations: Not on file    Relationship status: Not on file  . Intimate partner violence:    Fear of current or ex partner: Not on file    Emotionally abused: Not on file    Physically abused: Not on file    Forced sexual activity: Not on file  Other Topics Concern  . Not on file  Social History Narrative  . Not on file   No outpatient medications have been marked as taking for the 07/15/17 encounter [facility] Encounter).   Allergies  Allergen Reactions  . Codeine     Rash  . Penicillins Cross Reactors Hives      ROS: As per HPI, remainder of ROS negative.   OBJECTIVE:   Vitals:   07/15/17 1144  BP: 136/88  Pulse: 84  Resp: 18  Temp: 97.7 F (36.5 C)  TempSrc: Oral  SpO2: 95%     General appearance: alert; no distress Eyes: PERRL; EOMI; conjunctiva normal HENT: normocephalic; atraumatic; TMs normal, canal normal, external ears normal without trauma; nasal mucosa normal; mild pharyngeal erythema without swelling or exudates. Neck: supple, no adenopathy Lungs: clear to auscultation bilaterally Heart: regular rate and rhythm  Back: no CVA tenderness Extremities: no cyanosis or edema; symmetrical with no gross deformities Skin: warm and dry Neurologic: normal gait; grossly normal Psychological: alert and cooperative; normal mood and affect      Labs:  Results for orders placed or performed during the hospital encounter of 07/15/17  POCT rapid strep A [facility])  Result Value Ref Range   Streptococcus, Group A Screen (Direct) NEGATIVE NEGATIVE    Labs Reviewed  CULTURE, GROUP A STREP [facility])  POCT RAPID STREP A    No results found.     ASSESSMENT & PLAN:  1. Pharyngitis, unspecified etiology     Meds ordered this encounter  Medications  . chlorhexidine (PERIDEX) 0.12 % solution    Sig: Use as directed 15 mLs in the mouth or throat 2 (two) times daily.    Dispense:  120 mL    Refill:  0    Reviewed  expectations re: course of current medical issues. Questions answered. Outlined signs and symptoms indicating need for more acute intervention. Patient verbalized understanding. After Visit Summary given.    Procedures:       Past Medical History:  Diagnosis Date  . Migraine without aura, without mention of intractable migraine without mention of status migrainosus 06/18/2012  . Obesity    Family History  Problem Relation Age of Onset  . Hypertension Father   . Diabetes Father   . Cancer Father        colon cancer  . Headache Sister   . Diabetes Sister    Social History   Socioeconomic History  . Marital status: Single    Spouse name: Not on file  . Number of children: 0  . Years of education: BA  . Highest education level: Not on file  Occupational History  . Occupation: Doctor, hospital: [employer]  . Financial resource strain: Not on file  . Food insecurity:    Worry: Not on file    Inability: Not on file  . Transportation needs:    Medical: Not on file    Non-medical: Not on file  Tobacco Use  . Smoking status: Never Smoker  . Smokeless tobacco: Never Used  Substance and Sexual Activity  . Alcohol use: Yes    Comment: 1 weekly  . Drug use: No  . Sexual activity: Never    Birth control/protection: Condom  Lifestyle  . Physical activity:    Days per week: Not on file    Minutes per session: Not on file  . Stress: Not on file  Relationships  . Social connections:    Talks on phone: Not on file    Gets together: Not on file    Attends religious service: Not on file    Active member of club or organization: Not on file    Attends meetings of clubs or organizations: Not on file    Relationship status: Not on file  . Intimate partner violence:    Fear of current or ex partner: Not on file    Emotionally abused: Not on file    Physically abused: Not on file    Forced sexual activity: Not on file  Other  Topics Concern  . Not on file  Social History Narrative  . Not on file   No outpatient medications have been marked as taking for the 07/15/17 encounter [facility] Encounter).   Allergies  Allergen Reactions  . Codeine     Rash  .  Penicillins Cross Reactors Hives      ROS: As per HPI, remainder of ROS negative.   OBJECTIVE:   Vitals:   07/15/17 1144  BP: 136/88  Pulse: 84  Resp: 18  Temp: 97.7 F (36.5 C)  TempSrc: Oral  SpO2: 95%     General appearance: alert; no distress Eyes: PERRL; EOMI; conjunctiva normal HENT: normocephalic; atraumatic; TMs normal, canal normal, external ears normal without trauma; nasal mucosa normal; oral mucosa normal Neck: supple Lungs: clear to auscultation bilaterally Heart: regular rate and rhythm Abdomen: soft, non-tender; bowel sounds normal; no masses or organomegaly; no guarding or rebound tenderness Back: no CVA tenderness Extremities: no cyanosis or edema; symmetrical with no gross deformities Skin: warm and dry Neurologic: normal gait; grossly normal Psychological: alert and cooperative; normal mood and affect      Labs:  Results for orders placed or performed during the hospital encounter of 07/15/17  POCT rapid strep A [facility])  Result Value Ref Range   Streptococcus, Group A Screen (Direct) NEGATIVE NEGATIVE    Labs Reviewed  CULTURE, GROUP A STREP [facility])  POCT RAPID STREP A    No results found.     ASSESSMENT & PLAN:  1. Pharyngitis, unspecified etiology     Meds ordered this encounter  Medications  . chlorhexidine (PERIDEX) 0.12 % solution    Sig: Use as directed 15 mLs in the mouth or throat 2 (two) times daily.    Dispense:  120 mL    Refill:  0    Reviewed expectations re: course of current medical issues. Questions answered. Outlined signs and symptoms indicating need for more acute intervention. Patient verbalized understanding. After Visit Summary given.    Procedures:       Robyn Haber, MD 07/15/17 1155

## 2017-07-15 NOTE — ED Triage Notes (Signed)
Pt sts sore throat

## 2017-07-17 LAB — CULTURE, GROUP A STREP (THRC)

## 2017-08-25 DIAGNOSIS — E559 Vitamin D deficiency, unspecified: Secondary | ICD-10-CM | POA: Diagnosis not present

## 2017-09-23 DIAGNOSIS — Z712 Person consulting for explanation of examination or test findings: Secondary | ICD-10-CM | POA: Diagnosis not present

## 2017-09-29 DIAGNOSIS — R112 Nausea with vomiting, unspecified: Secondary | ICD-10-CM | POA: Diagnosis not present

## 2017-11-17 DIAGNOSIS — R252 Cramp and spasm: Secondary | ICD-10-CM | POA: Diagnosis not present

## 2017-11-17 DIAGNOSIS — T753XXA Motion sickness, initial encounter: Secondary | ICD-10-CM | POA: Diagnosis not present

## 2017-11-17 DIAGNOSIS — E559 Vitamin D deficiency, unspecified: Secondary | ICD-10-CM | POA: Diagnosis not present

## 2017-12-09 DIAGNOSIS — E559 Vitamin D deficiency, unspecified: Secondary | ICD-10-CM | POA: Diagnosis not present

## 2018-02-02 DIAGNOSIS — R102 Pelvic and perineal pain: Secondary | ICD-10-CM | POA: Diagnosis not present

## 2018-02-15 IMAGING — US US THYROID
1 series · 13 of 25 positions shown · non-contrast
Comparison: None.

CLINICAL DATA: Thyromegaly

EXAM:
THYROID ULTRASOUND
TECHNIQUE: Ultrasound examination of the thyroid gland and adjacent soft
tissues was performed.

[Series 1: us thyroid · 0.08mm/px · 13 of 64 slices shown]
[im 1/64]
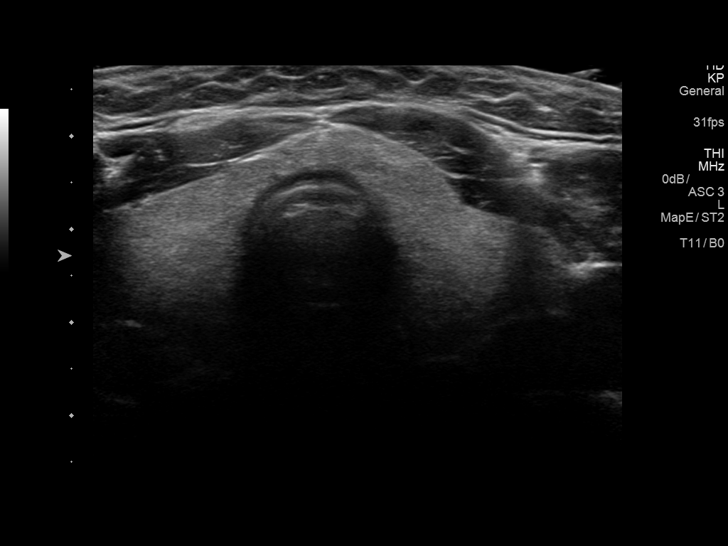
[im 6/64]
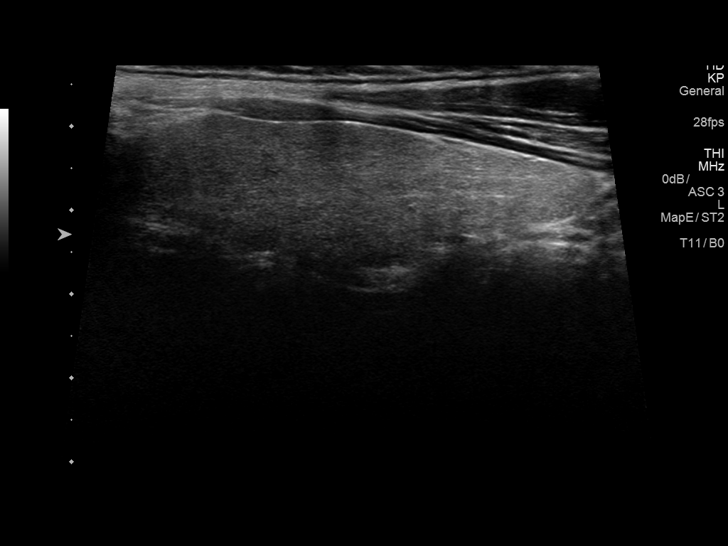
[im 11/64]
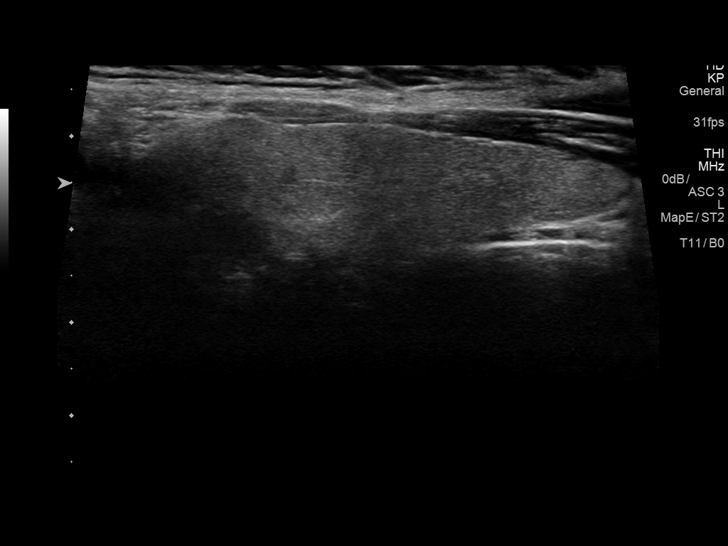
[im 16/64]
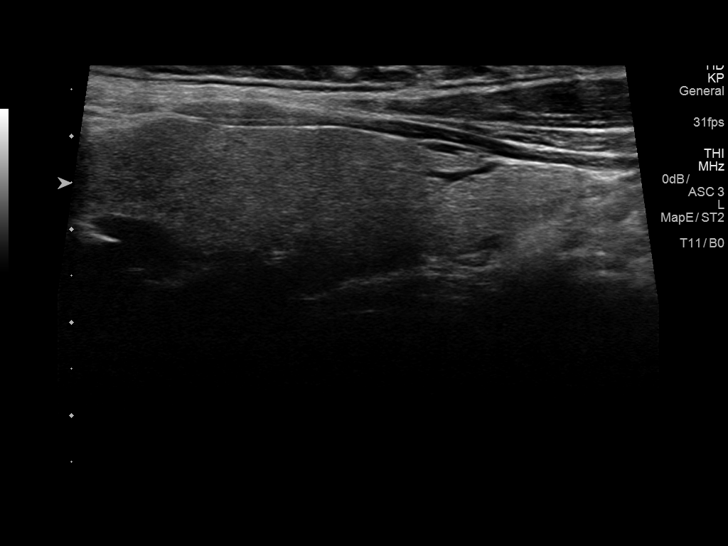
[im 22/64]
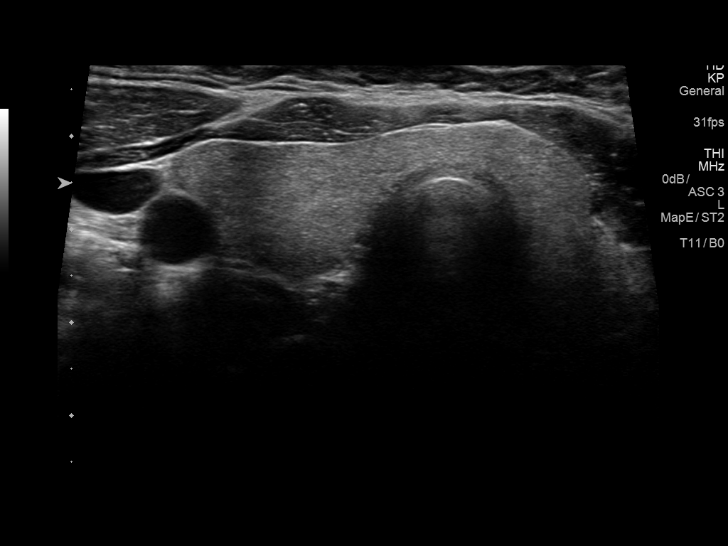
[im 27/64]
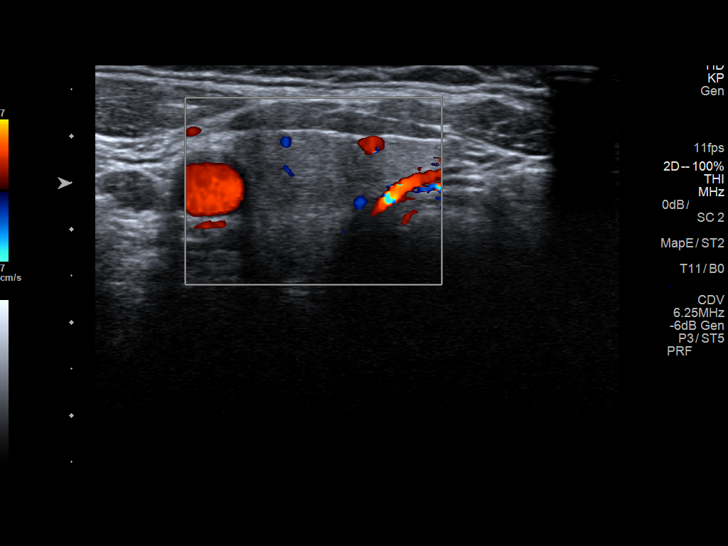
[im 32/64]
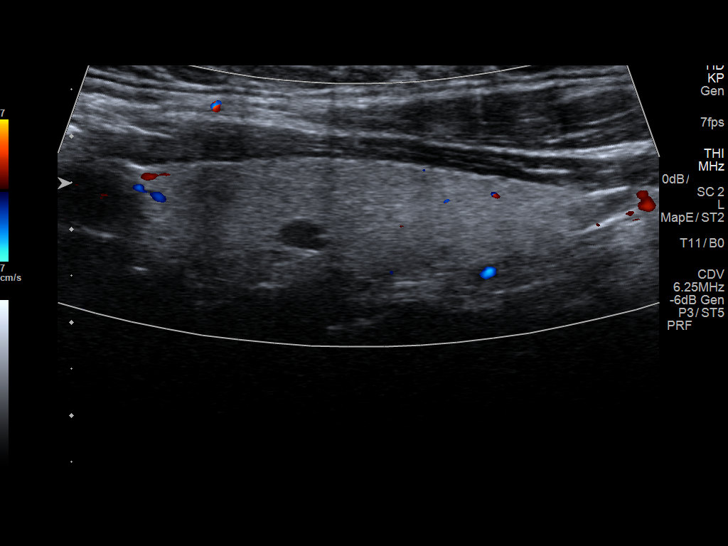
[im 37/64]
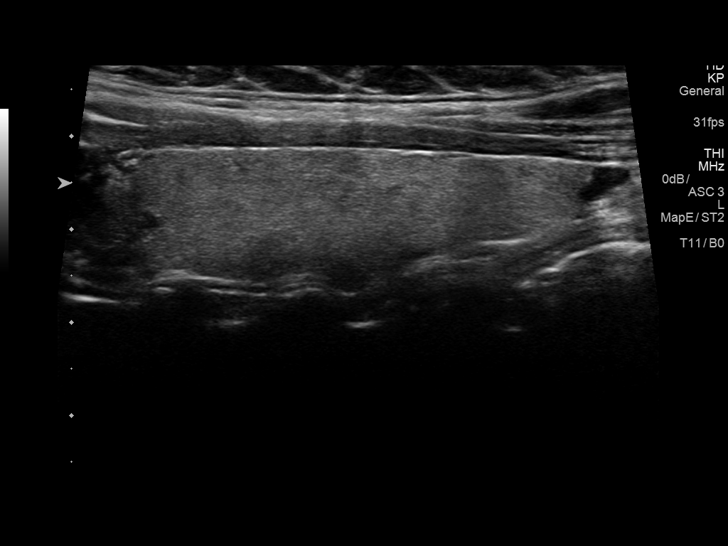
[im 43/64]
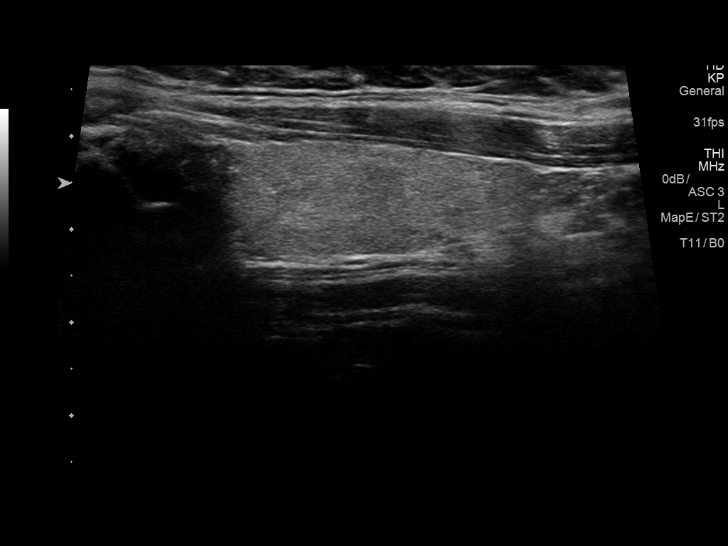
[im 48/64]
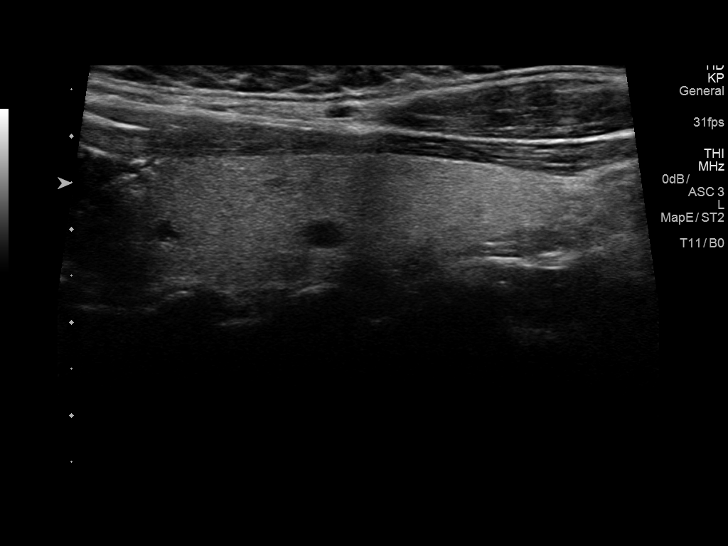
[im 53/64]
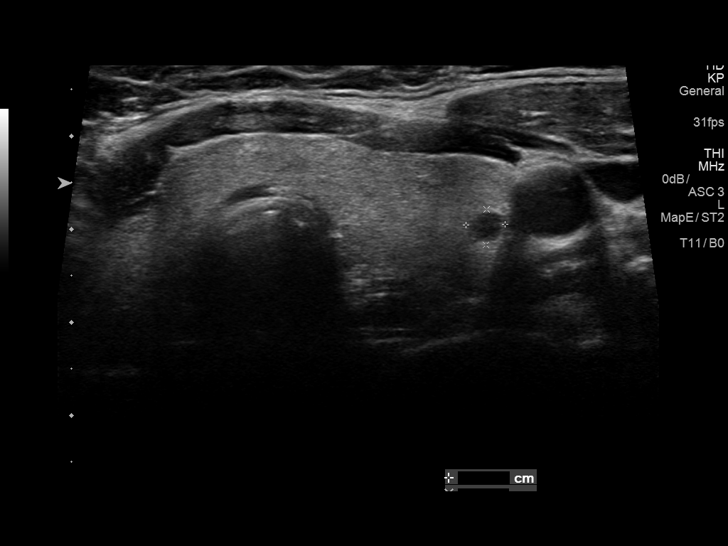
[im 58/64]
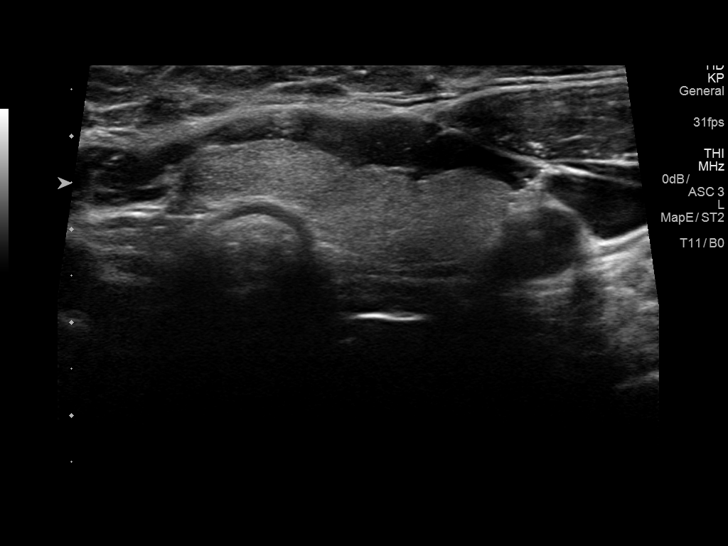
[im 64/64]
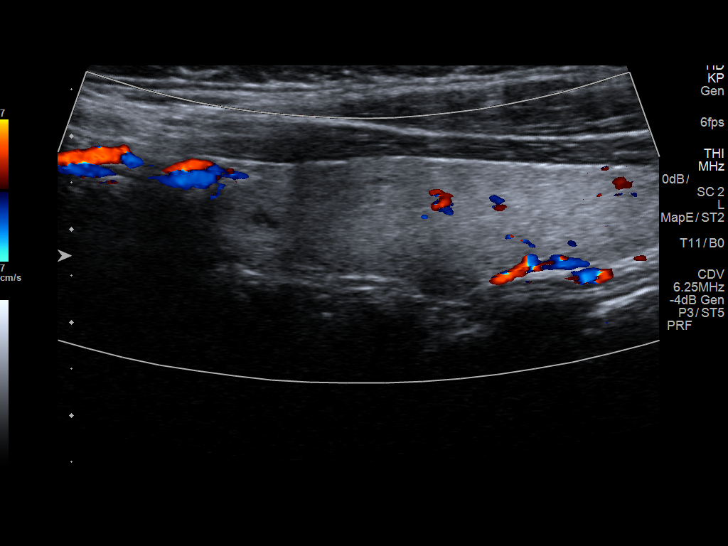

[13 of 25 positions shown; findings below may reference images not displayed]

FINDINGS: Parenchymal Echotexture: Mildly heterogenous

Isthmus: 0.8 cm thickness

Right lobe: 5.2 x 1.5 x 2.4 cm

Left lobe: 5.3 x 1.5 x 2.1 cm

_________________________________________________________

Estimated total number of nodules >/= 1 cm: 1

Number of spongiform nodules >/=  2 cm not described below (TR1): 0

Number of mixed cystic and solid nodules >/= 1.5 cm not described
below (TR2): 0

_________________________________________________________

Nodule # 1:

Location: Right; Superior

Maximum size: 1.3 cm; Other 2 dimensions: 0.9 x 1.3 cm

Composition: solid/almost completely solid (2)

Echogenicity: hypoechoic (2)

Shape: not taller-than-wide (0)

Margins: ill-defined (0)

Echogenic foci: none (0)

ACR TI-RADS total points: 4.

ACR TI-RADS risk category: TR4 (4-6 points).

ACR TI-RADS recommendations:

*Given size (>/= 1 - 1.4 cm) and appearance, a follow-up ultrasound
in 1 year should be considered based on TI-RADS criteria.

_________________________________________________________

0.6 cm hypoechoic nodule without calcifications, mid left lobe.

Scattered subcentimeter bilateral colloid cysts.
IMPRESSION: 1. Thyromegaly with small bilateral nodules. None meets criteria for
biopsy.
2. Recommend 1 year follow-up surveillance ultrasound to confirm
stability.

The above is in keeping with the ACR TI-RADS recommendations - [HOSPITAL] 2472;[DATE].

## 2018-02-22 DIAGNOSIS — N83202 Unspecified ovarian cyst, left side: Secondary | ICD-10-CM | POA: Diagnosis not present

## 2018-03-29 DIAGNOSIS — N83202 Unspecified ovarian cyst, left side: Secondary | ICD-10-CM | POA: Diagnosis not present

## 2018-04-16 NOTE — H&P (Signed)
NAME: Michelle Thomas, Michelle Thomas MEDICAL RECORD [id number] ACCOUNT [id number] DATE OF BIRTH:1975-09-04 FACILITY: [facility] LOCATION:  PHYSICIAN:Charliegh Vasudevan Sherran Needs, MD  HISTORY AND PHYSICAL  DATE OF ADMISSION:  05/03/2018  HISTORY OF PRESENT ILLNESS:  In relation to the present admission, the patient is a 43 year old nulligravida female who has been having trouble with some lower pelvic pain and pressure.  She underwent an ultrasound in November of last year that revealed  a 4.4 x 3 cm left ovarian cyst.  We felt like it was functional in nature.  This persisted on followup ultrasound evaluations.  Because of the discomfort that she was experiencing with the cyst which may be unrelated to it, she has decided now to proceed  with diagnostic laparoscopy and ovarian cystectomy.  The nature of the procedure have been discussed with the patient.    ALLERGIES:  In terms of allergies, she has no known drug allergies.  MEDICATIONS:  None.  PAST MEDICAL HISTORY:  Usual childhood diseases without any significant sequelae.  PAST SURGICAL HISTORY:  No previous surgical history.  SOCIAL HISTORY:  No tobacco and occasional alcohol use.  FAMILY HISTORY:  A history of colon and breast cancer.  REVIEW OF SYSTEMS:  Noncontributory.  PHYSICAL EXAMINATION: VITAL SIGNS:  Afebrile, stable vital signs. HEENT:  Patient is normocephalic.  Pupils equal, round, reactive to light and accommodation.  Extraocular movements were intact.  Sclerae and conjunctivae to clear.  Oropharynx clear. NECK:  Without thyromegaly. BREASTS:  No discrete masses. LUNGS:  Clear. HEART:  Regular rhythm and rate without murmurs or gallops. ABDOMEN:  Benign.  No masses, organomegaly or tenderness. PELVIC:  Normal external genitalia.  Vaginal mucosa is clear.  Cervix is unremarkable.  Uterus normal size and shape.  Adnexa unremarkable.  Difficult to feel the cyst. EXTREMITIES:  Trace edema. NEUROLOGIC:  Grossly normal  limits.  ASSESSMENT:  Persistent left ovarian cyst with associated discomfort.  PLAN:  The patient to undergo diagnostic laparoscopy, possible ovarian cystectomy.  The risks of surgery have been discussed including the risk of infection.  The risk of hemorrhage that could require transfusion with the risk of AIDS or hepatitis.  Risk  of injury to adjacent organs including bowel, bladder or ureters that could require further exploratory surgery.  The risk of deep venous thrombosis and pulmonary embolus.  The patient expressed understanding of indications and risks.  AN/NUANCE  D:04/16/2018 T:04/16/2018 JOB:[id number]

## 2018-04-16 NOTE — H&P (Signed)
Patient name Michelle Thomas, Michelle Thomas DICTATION# [id number] CSN# [id number]  Arvella Nigh, MD 04/16/2018 6:39 AM

## 2018-04-26 ENCOUNTER — Other Ambulatory Visit: Payer: Self-pay

## 2018-04-26 ENCOUNTER — Encounter (HOSPITAL_BASED_OUTPATIENT_CLINIC_OR_DEPARTMENT_OTHER): Payer: Self-pay | Admitting: *Deleted

## 2018-04-26 DIAGNOSIS — N83202 Unspecified ovarian cyst, left side: Secondary | ICD-10-CM | POA: Diagnosis not present

## 2018-04-26 NOTE — Progress Notes (Signed)
Spoke with Chanika Npo after midnight food, eras protocol with clear liquids until 430 am and drink presurgery shake at 430 am, then npo  arrive 530 am 05-03-18 [facility] Driver mother adele os sister Bing Neighbors Has surgery orders in epic Has lab appointment 04-29-18 for cbc, bmet, type and screen and serum pregnency

## 2018-04-29 ENCOUNTER — Encounter (HOSPITAL_COMMUNITY)
Admission: RE | Admit: 2018-04-29 | Discharge: 2018-04-29 | Disposition: A | Discharge status: Home / Self Care | Payer: 59 | Source: Ambulatory Visit | Attending: Obstetrics and Gynecology | Admitting: Obstetrics and Gynecology

## 2018-04-29 DIAGNOSIS — Z01812 Encounter for preprocedural laboratory examination: Secondary | ICD-10-CM | POA: Diagnosis present

## 2018-04-29 LAB — CBC
HEMATOCRIT: 40.2 % (ref 36.0–46.0)
Hemoglobin: 11.8 g/dL — ABNORMAL LOW (ref 12.0–15.0)
MCH: 23 pg — ABNORMAL LOW (ref 26.0–34.0)
MCHC: 29.4 g/dL — ABNORMAL LOW (ref 30.0–36.0)
MCV: 78.4 fL — AB (ref 80.0–100.0)
Platelets: 205 10*3/uL (ref 150–400)
RBC: 5.13 MIL/uL — ABNORMAL HIGH (ref 3.87–5.11)
RDW: 15.6 % — ABNORMAL HIGH (ref 11.5–15.5)
WBC: 7.1 10*3/uL (ref 4.0–10.5)
nRBC: 0 % (ref 0.0–0.2)

## 2018-04-29 LAB — BASIC METABOLIC PANEL
Anion gap: 6 (ref 5–15)
BUN: 12 mg/dL (ref 6–20)
CHLORIDE: 106 mmol/L (ref 98–111)
CO2: 25 mmol/L (ref 22–32)
Calcium: 9 mg/dL (ref 8.9–10.3)
Creatinine, Ser: 0.91 mg/dL (ref 0.44–1.00)
GFR calc Af Amer: >60 mL/min (ref >60)
GFR calc non Af Amer: >60 mL/min (ref >60)
Glucose, Bld: 88 mg/dL (ref 70–99)
Potassium: 4.2 mmol/L (ref 3.5–5.1)
Sodium: 137 mmol/L (ref 135–145)

## 2018-04-29 LAB — HCG, SERUM, QUALITATIVE: Preg, Serum: NEGATIVE

## 2018-04-29 LAB — ABO/RH: ABO/RH(D): AB POS

## 2018-05-03 ENCOUNTER — Encounter (HOSPITAL_BASED_OUTPATIENT_CLINIC_OR_DEPARTMENT_OTHER)
Admission: RE | Disposition: A | Discharge status: Home / Self Care | Payer: Self-pay | Source: Home / Self Care | Attending: Obstetrics and Gynecology

## 2018-05-03 ENCOUNTER — Encounter (HOSPITAL_BASED_OUTPATIENT_CLINIC_OR_DEPARTMENT_OTHER): Payer: Self-pay

## 2018-05-03 ENCOUNTER — Ambulatory Visit (HOSPITAL_BASED_OUTPATIENT_CLINIC_OR_DEPARTMENT_OTHER): Payer: 59 | Admitting: Anesthesiology

## 2018-05-03 ENCOUNTER — Ambulatory Visit (HOSPITAL_BASED_OUTPATIENT_CLINIC_OR_DEPARTMENT_OTHER)
Admission: RE | Admit: 2018-05-03 | Discharge: 2018-05-03 | Disposition: A | Discharge status: Home / Self Care | Payer: 59 | Attending: Obstetrics and Gynecology | Admitting: Obstetrics and Gynecology

## 2018-05-03 DIAGNOSIS — R102 Pelvic and perineal pain: Secondary | ICD-10-CM | POA: Insufficient documentation

## 2018-05-03 DIAGNOSIS — R8569 Abnormal cytological findings in specimens from other digestive organs and abdominal cavity: Secondary | ICD-10-CM | POA: Diagnosis not present

## 2018-05-03 DIAGNOSIS — D271 Benign neoplasm of left ovary: Secondary | ICD-10-CM | POA: Insufficient documentation

## 2018-05-03 DIAGNOSIS — N83202 Unspecified ovarian cyst, left side: Secondary | ICD-10-CM | POA: Diagnosis not present

## 2018-05-03 HISTORY — PX: OVARIAN CYST REMOVAL: SHX89

## 2018-05-03 HISTORY — PX: LAPAROSCOPY: SHX197

## 2018-05-03 LAB — TYPE AND SCREEN
ABO/RH(D): AB POS
Antibody Screen: NEGATIVE

## 2018-05-03 SURGERY — LAPAROSCOPY, DIAGNOSTIC
Anesthesia: General | Site: Abdomen

## 2018-05-03 MED ORDER — SODIUM CHLORIDE 0.9 % IR SOLN
Status: DC | PRN
  Administered 2018-05-03: 1000 mL

## 2018-05-03 MED ORDER — FENTANYL CITRATE (PF) 100 MCG/2ML IJ SOLN
25.0000 ug | INTRAMUSCULAR | Status: DC | PRN
  Administered 2018-05-03 (×3): 25 ug via INTRAVENOUS
  Filled 2018-05-03: qty 1

## 2018-05-03 MED ORDER — ONDANSETRON HCL 4 MG/2ML IJ SOLN
INTRAMUSCULAR | Status: AC
  Filled 2018-05-03: qty 2

## 2018-05-03 MED ORDER — METOCLOPRAMIDE HCL 5 MG/ML IJ SOLN
INTRAMUSCULAR | Status: AC
  Filled 2018-05-03: qty 2

## 2018-05-03 MED ORDER — PROPOFOL 10 MG/ML IV BOLUS
INTRAVENOUS | Status: DC | PRN
  Administered 2018-05-03: 30 mg via INTRAVENOUS
  Administered 2018-05-03: 200 mg via INTRAVENOUS

## 2018-05-03 MED ORDER — ONDANSETRON HCL 4 MG/2ML IJ SOLN
INTRAMUSCULAR | Status: DC | PRN
  Administered 2018-05-03 (×2): 4 mg via INTRAVENOUS

## 2018-05-03 MED ORDER — LIDOCAINE 2% (20 MG/ML) 5 ML SYRINGE
INTRAMUSCULAR | Status: AC
  Filled 2018-05-03: qty 5

## 2018-05-03 MED ORDER — ROCURONIUM BROMIDE 100 MG/10ML IV SOLN
INTRAVENOUS | Status: DC | PRN
  Administered 2018-05-03: 50 mg via INTRAVENOUS
  Administered 2018-05-03: 10 mg via INTRAVENOUS

## 2018-05-03 MED ORDER — KETOROLAC TROMETHAMINE 30 MG/ML IJ SOLN
INTRAMUSCULAR | Status: DC | PRN
  Administered 2018-05-03: 30 mg via INTRAVENOUS

## 2018-05-03 MED ORDER — METOCLOPRAMIDE HCL 5 MG/ML IJ SOLN
INTRAMUSCULAR | Status: DC | PRN
  Administered 2018-05-03: 10 mg via INTRAVENOUS

## 2018-05-03 MED ORDER — DEXAMETHASONE SODIUM PHOSPHATE 4 MG/ML IJ SOLN
INTRAMUSCULAR | Status: DC | PRN
  Administered 2018-05-03: 10 mg via INTRAVENOUS

## 2018-05-03 MED ORDER — LIDOCAINE HCL (CARDIAC) PF 100 MG/5ML IV SOSY
PREFILLED_SYRINGE | INTRAVENOUS | Status: DC | PRN
  Administered 2018-05-03: 100 mg via INTRAVENOUS

## 2018-05-03 MED ORDER — FENTANYL CITRATE (PF) 100 MCG/2ML IJ SOLN
INTRAMUSCULAR | Status: AC
  Filled 2018-05-03: qty 2

## 2018-05-03 MED ORDER — SCOPOLAMINE 1 MG/3DAYS TD PT72
MEDICATED_PATCH | TRANSDERMAL | Status: DC | PRN
  Administered 2018-05-03: 1 via TRANSDERMAL

## 2018-05-03 MED ORDER — ROCURONIUM BROMIDE 100 MG/10ML IV SOLN
INTRAVENOUS | Status: AC
  Filled 2018-05-03: qty 1

## 2018-05-03 MED ORDER — SUGAMMADEX SODIUM 200 MG/2ML IV SOLN
INTRAVENOUS | Status: AC
  Filled 2018-05-03: qty 2

## 2018-05-03 MED ORDER — FENTANYL CITRATE (PF) 100 MCG/2ML IJ SOLN
INTRAMUSCULAR | Status: DC | PRN
  Administered 2018-05-03: 50 ug via INTRAVENOUS
  Administered 2018-05-03: 25 ug via INTRAVENOUS
  Administered 2018-05-03: 50 ug via INTRAVENOUS
  Administered 2018-05-03: 25 ug via INTRAVENOUS
  Administered 2018-05-03 (×2): 50 ug via INTRAVENOUS

## 2018-05-03 MED ORDER — TRAMADOL-ACETAMINOPHEN 37.5-325 MG PO TABS: 1.0000 | ORAL_TABLET | ORAL | 0 refills | Status: AC | PRN

## 2018-05-03 MED ORDER — FENTANYL CITRATE (PF) 250 MCG/5ML IJ SOLN
INTRAMUSCULAR | Status: AC
  Filled 2018-05-03: qty 5

## 2018-05-03 MED ORDER — DEXAMETHASONE SODIUM PHOSPHATE 10 MG/ML IJ SOLN
INTRAMUSCULAR | Status: AC
  Filled 2018-05-03: qty 1

## 2018-05-03 MED ORDER — MIDAZOLAM HCL 2 MG/2ML IJ SOLN
INTRAMUSCULAR | Status: AC
  Filled 2018-05-03: qty 2

## 2018-05-03 MED ORDER — BUPIVACAINE HCL 0.25 % IJ SOLN
INTRAMUSCULAR | Status: DC | PRN
  Administered 2018-05-03: 7 mL

## 2018-05-03 MED ORDER — SUGAMMADEX SODIUM 200 MG/2ML IV SOLN
INTRAVENOUS | Status: DC | PRN
  Administered 2018-05-03: 200 mg via INTRAVENOUS

## 2018-05-03 MED ORDER — MIDAZOLAM HCL 5 MG/5ML IJ SOLN
INTRAMUSCULAR | Status: DC | PRN
  Administered 2018-05-03: 2 mg via INTRAVENOUS

## 2018-05-03 MED ORDER — PROPOFOL 10 MG/ML IV BOLUS
INTRAVENOUS | Status: AC
  Filled 2018-05-03: qty 40

## 2018-05-03 MED ORDER — DEXTROSE 5 % IV SOLN
INTRAVENOUS | Status: AC
  Administered 2018-05-03: 470 mg via INTRAVENOUS
  Filled 2018-05-03: qty 11.75

## 2018-05-03 MED ORDER — SCOPOLAMINE 1 MG/3DAYS TD PT72
MEDICATED_PATCH | TRANSDERMAL | Status: AC
  Filled 2018-05-03: qty 1

## 2018-05-03 MED ORDER — LACTATED RINGERS IV SOLN
INTRAVENOUS | Status: DC
  Administered 2018-05-03 (×2): via INTRAVENOUS
  Filled 2018-05-03: qty 1000

## 2018-05-03 SURGICAL SUPPLY — 38 items
ADH SKN CLS APL DERMABOND .7 (GAUZE/BANDAGES/DRESSINGS) ×1
APPLICATOR COTTON TIP 6IN STRL (MISCELLANEOUS) IMPLANT
CANISTER SUCT 3000ML PPV (MISCELLANEOUS) IMPLANT
CANISTER SUCTION 1200CC (MISCELLANEOUS) IMPLANT
CATH ROBINSON RED A/P 16FR (CATHETERS) ×3 IMPLANT
COVER MAYO STAND STRL (DRAPES) ×3 IMPLANT
COVER WAND RF STERILE (DRAPES) ×3 IMPLANT
DERMABOND ADVANCED (GAUZE/BANDAGES/DRESSINGS) ×1
DERMABOND ADVANCED .7 DNX12 (GAUZE/BANDAGES/DRESSINGS) ×2 IMPLANT
DRSG COVADERM PLUS 2X2 (GAUZE/BANDAGES/DRESSINGS) IMPLANT
DURAPREP 26ML APPLICATOR (WOUND CARE) ×3 IMPLANT
GAUZE 4X4 16PLY RFD (DISPOSABLE) ×3 IMPLANT
GLOVE BIO SURGEON STRL SZ7 (GLOVE) ×6 IMPLANT
GOWN STRL REUS W/TWL XL LVL3 (GOWN DISPOSABLE) ×3 IMPLANT
KIT TURNOVER CYSTO (KITS) ×3 IMPLANT
NEEDLE INSUFFLATION 120MM (ENDOMECHANICALS) IMPLANT
NS IRRIG 500ML POUR BTL (IV SOLUTION) ×3 IMPLANT
PACK LAPAROSCOPY BASIN (CUSTOM PROCEDURE TRAY) ×3 IMPLANT
PAD OB MATERNITY 4.3X12.25 (PERSONAL CARE ITEMS) ×3 IMPLANT
PAD PREP 24X48 CUFFED NSTRL (MISCELLANEOUS) ×3 IMPLANT
SCISSORS LAP 5X45 EPIX DISP (ENDOMECHANICALS) IMPLANT
SEALER TISSUE G2 CVD JAW 45CM (ENDOMECHANICALS) IMPLANT
SET IRRIG TUBING LAPAROSCOPIC (IRRIGATION / IRRIGATOR) IMPLANT
SET IRRIG Y TYPE TUR BLADDER L (SET/KITS/TRAYS/PACK) IMPLANT
SUT VIC AB 3-0 PS2 18 (SUTURE) ×3
SUT VIC AB 3-0 PS2 18XBRD (SUTURE) ×2 IMPLANT
SUT VICRYL 0 ENDOLOOP (SUTURE) IMPLANT
SUT VICRYL 0 UR6 27IN ABS (SUTURE) IMPLANT
SUT VICRYL 4-0 PS2 18IN ABS (SUTURE) IMPLANT
SYS BAG RETRIEVAL 10MM (BASKET)
SYSTEM BAG RETRIEVAL 10MM (BASKET) IMPLANT
TOWEL OR 17X24 6PK STRL BLUE (TOWEL DISPOSABLE) ×6 IMPLANT
TROCAR BALLN 12MMX100 BLUNT (TROCAR) IMPLANT
TROCAR OPTI TIP 5M 100M (ENDOMECHANICALS) ×3 IMPLANT
TROCAR XCEL DIL TIP R 11M (ENDOMECHANICALS) IMPLANT
TUBING INSUF HEATED (TUBING) ×3 IMPLANT
WARMER LAPAROSCOPE (MISCELLANEOUS) ×3 IMPLANT
WATER STERILE IRR 500ML POUR (IV SOLUTION) ×3 IMPLANT

## 2018-05-03 NOTE — Anesthesia Postprocedure Evaluation (Signed)
Anesthesia Post Note  Patient: Michelle Thomas  Procedure(s) Performed: LAPAROSCOPY DIAGNOSTIC, (N/A Abdomen) LEFT OVARIAN CYSTECTOMY (Left Abdomen)     Patient location during evaluation: PACU Anesthesia Type: General Level of consciousness: awake Pain management: pain level controlled Vital Signs Assessment: post-procedure vital signs reviewed and stable Respiratory status: spontaneous breathing Cardiovascular status: stable Postop Assessment: no apparent nausea or vomiting Anesthetic complications: no    Last Vitals:  Vitals:   05/03/18 0909 05/03/18 0930  BP: (!) 180/112 (!) 169/91  Pulse: 84   Resp: 14   Temp: 36.8 C   SpO2:      Last Pain:  Vitals:   05/03/18 0930  TempSrc:   PainSc: 0-No pain                 Tallula Grindle

## 2018-05-03 NOTE — Discharge Instructions (Signed)

## 2018-05-03 NOTE — Op Note (Signed)
NAME: Michelle Thomas, Michelle Thomas MEDICAL RECORD [id number] ACCOUNT [id number] DATE OF BIRTH:06-13-1975 FACILITY: [facility] LOCATION: [facility] PHYSICIAN:Papa [place] Sherran Needs, MD  OPERATIVE REPORT  DATE OF PROCEDURE:  05/03/2018  PREOPERATIVE DIAGNOSIS:  Pelvic pain with cystic enlargement of the left ovary.  POSTOPERATIVE DIAGNOSES:  Pelvic pain with cystic enlargement of the left ovary.  PROCEDURE:  Laparoscopy with left ovarian cystectomy.  SURGEON:  Darlyn Chamber, MD  ANESTHESIA:  General.  ESTIMATED BLOOD LOSS:  Minimal.  PACKS AND DRAINS:  None.  INTRAOPERATIVE BLOOD PLACED:  None.  COMPLICATIONS:  None.  INDICATIONS:  Dictated in history and physical.  DESCRIPTION OF PROCEDURE:  The patient was taken to the OR and placed in supine position.  After satisfactory level of general anesthesia was obtained, she was placed in the dorsal lithotomy position using Allen stirrups.  The perineum and vagina were  prepped out with Betadine.  Bladder was emptied via catheterization.  Speculum was placed in the vaginal vault.  A Hulka tenaculum was inserted and secured.  Speculum was then removed.  Abdomen was prepped with DuraPrep and after a period of time was draped in sterile field.  Subumbilical incision made with a knife.  Veress needle was introduced into the abdominal cavity without difficulty.  Abdomen inflated with approximately 3 liters  of carbon dioxide.  A 10/11 trocar was put in place and secured.  Laparoscope was introduced.  There was no evidence of injury to adjacent organs.  A 5 mm trocar was put in place in suprapubic area under direct visualization.  Visualization of the upper  abdomen including liver tip the gallbladder were clear.  Appendix was normal.  Both lateral gutters were clear.  Right tube and ovary were clear.  Cul-de-sac was clear.  Left ovary was slightly adherent to the uterosacral ligament.  It was enlarged.  We  put in a third 5 mm trocar in the left lower  quadrant after visualization of the epigastric vessels.  Using the monopolar scissors, an incision was made in the ovary.  We dissected down to the cyst.  It was entered and the cyst lining was dissected free  and sent to pathology.  It was a smooth wall cyst with clear fluid.  One additional note, we did obtain washings prior to doing this.  We then used the monopolar scissors to bring about hemostasis.  We had good hemostasis.  The ovary had 1 thick adhesion  to the uterosacral ligament.  We left it in place.  Otherwise, the ovary was free and unremarkable.  There was no endometriosis or other signs of pelvic pathology.  We irrigated the pelvis, removed the irrigation.  There was no active bleeding.  At this  point in time, the abdomen was reinflated with carbon dioxide.  All trocars were removed.  Subumbilical incision was closed with interrupted subcuticulars of 4-0 Vicryl.  Suprapubic incision closed with Dermabond.  Hulka tenaculum was then removed.   Sponge, instrument and needle count was correct by the circulating nurse.    The patient tolerated the procedure well and returned to recovery room in good condition.  AN/NUANCE  D:05/03/2018 T:05/03/2018 JOB:[id number]

## 2018-05-03 NOTE — Transfer of Care (Signed)
Immediate Anesthesia Transfer of Care Note  Patient: Michelle Thomas  Procedure(s) Performed: Procedure(s) (LRB): LAPAROSCOPY DIAGNOSTIC, (N/A) LEFT OVARIAN CYSTECTOMY (Left)  Patient Location: PACU  Anesthesia Type: General  Level of Consciousness: awake, sedated, patient cooperative and responds to stimulation  Airway & Oxygen Therapy: Patient Spontanous Breathing and Patient connected to face mask oxygen  Post-op Assessment: Report given to PACU RN, Post -op Vital signs reviewed and stable and Patient moving all extremities  Post vital signs: Reviewed and stable  Complications: No apparent anesthesia complications

## 2018-05-03 NOTE — Brief Op Note (Signed)
05/03/2018  9:00 AM  PATIENT:  [place]  43 y.o. female  PRE-OPERATIVE DIAGNOSIS:  left ovarian cyst  POST-OPERATIVE DIAGNOSIS:  left ovarian cyst  PROCEDURE:  Procedure(s): LAPAROSCOPY DIAGNOSTIC, (N/A) LEFT OVARIAN CYSTECTOMY (Left)  SURGEON:  Surgeon(s) and Role:    * Arvella Nigh, MD - Primary  PHYSICIAN ASSISTANT:   ASSISTANTS: none   ANESTHESIA:   local and general  EBL:  5 mL   BLOOD ADMINISTERED:none  DRAINS: none   LOCAL MEDICATIONS USED:  MARCAINE     SPECIMEN:  Source of Specimen:  cust lining  DISPOSITION OF SPECIMEN:  PATHOLOGY  COUNTS:  YES  TOURNIQUET:  * No tourniquets in log *  DICTATION: .Other Dictation: Dictation Number [id number]  PLAN OF CARE: Discharge to home after PACU  PATIENT DISPOSITION:  PACU - hemodynamically stable.   Delay start of Pharmacological VTE agent (>24hrs) due to surgical blood loss or risk of bleeding: no

## 2018-05-03 NOTE — H&P (Signed)
  History and physical exam unchanged 

## 2018-05-03 NOTE — Anesthesia Preprocedure Evaluation (Signed)
Anesthesia Evaluation  Patient identified by MRN, date of birth, ID band Patient awake  General Assessment Comment:History noted CG  Reviewed: Allergy & Precautions, NPO status , reviewed documented beta blocker date and time   Airway Mallampati: II  TM Distance: >3 FB     Dental   Pulmonary    breath sounds clear to auscultation       Cardiovascular negative cardio ROS   Rhythm:Regular Rate:Normal     Neuro/Psych    GI/Hepatic negative GI ROS, Neg liver ROS,   Endo/Other    Renal/GU negative Renal ROS     Musculoskeletal   Abdominal   Peds  Hematology   Anesthesia Other Findings   Reproductive/Obstetrics                             Anesthesia Physical Anesthesia Plan  ASA: II  Anesthesia Plan: General   Post-op Pain Management:    Induction: Intravenous  PONV Risk Score and Plan: Ondansetron, Dexamethasone and Midazolam  Airway Management Planned: Oral ETT  Additional Equipment:   Intra-op Plan:   Post-operative Plan: Extubation in OR  Informed Consent: I have reviewed the patients History and Physical, chart, labs and discussed the procedure including the risks, benefits and alternatives for the proposed anesthesia with the patient or authorized representative who has indicated his/her understanding and acceptance.     Dental advisory given  Plan Discussed with: CRNA and Anesthesiologist  Anesthesia Plan Comments:         Anesthesia Quick Evaluation

## 2018-05-03 NOTE — Anesthesia Procedure Notes (Signed)
Procedure Name: Intubation Date/Time: 05/03/2018 7:34 AM Performed by: Justice Rocher, CRNA Pre-anesthesia Checklist: Patient identified, Emergency Drugs available, Suction available and Patient being monitored Patient Re-evaluated:Patient Re-evaluated prior to induction Oxygen Delivery Method: Circle system utilized Preoxygenation: Pre-oxygenation with 100% oxygen Induction Type: IV induction Ventilation: Mask ventilation without difficulty Laryngoscope Size: Mac and 4 Grade View: Grade III Tube type: Oral Tube size: 7.5 mm Number of attempts: 1 Airway Equipment and Method: Stylet and Oral airway Placement Confirmation: ETT inserted through vocal cords under direct vision,  positive ETCO2 and breath sounds checked- equal and bilateral Secured at: 24 cm Tube secured with: Tape Dental Injury: Teeth and Oropharynx as per pre-operative assessment

## 2018-05-04 ENCOUNTER — Encounter (HOSPITAL_BASED_OUTPATIENT_CLINIC_OR_DEPARTMENT_OTHER): Payer: Self-pay | Admitting: Obstetrics and Gynecology

## 2018-05-11 DIAGNOSIS — R350 Frequency of micturition: Secondary | ICD-10-CM | POA: Diagnosis not present

## 2018-06-11 DIAGNOSIS — E559 Vitamin D deficiency, unspecified: Secondary | ICD-10-CM | POA: Diagnosis not present

## 2018-06-11 DIAGNOSIS — R51 Headache: Secondary | ICD-10-CM | POA: Diagnosis not present

## 2018-06-11 DIAGNOSIS — R42 Dizziness and giddiness: Secondary | ICD-10-CM | POA: Diagnosis not present

## 2018-07-07 ENCOUNTER — Encounter (HOSPITAL_COMMUNITY): Payer: Self-pay

## 2018-07-07 ENCOUNTER — Other Ambulatory Visit: Payer: Self-pay

## 2018-07-07 ENCOUNTER — Ambulatory Visit (HOSPITAL_COMMUNITY)
Admission: EM | Admit: 2018-07-07 | Discharge: 2018-07-07 | Disposition: A | Discharge status: Home / Self Care | Payer: 59 | Attending: Family Medicine | Admitting: Family Medicine

## 2018-07-07 DIAGNOSIS — G43909 Migraine, unspecified, not intractable, without status migrainosus: Secondary | ICD-10-CM

## 2018-07-07 MED ORDER — METOCLOPRAMIDE HCL 5 MG/ML IJ SOLN
5.0000 mg | Freq: Once | INTRAMUSCULAR | Status: AC
  Administered 2018-07-07: 5 mg via INTRAMUSCULAR

## 2018-07-07 MED ORDER — KETOROLAC TROMETHAMINE 60 MG/2ML IM SOLN
INTRAMUSCULAR | Status: AC
  Filled 2018-07-07: qty 2

## 2018-07-07 MED ORDER — KETOROLAC TROMETHAMINE 60 MG/2ML IM SOLN
60.0000 mg | Freq: Once | INTRAMUSCULAR | Status: AC
  Administered 2018-07-07: 60 mg via INTRAMUSCULAR

## 2018-07-07 MED ORDER — DEXAMETHASONE SODIUM PHOSPHATE 10 MG/ML IJ SOLN
10.0000 mg | Freq: Once | INTRAMUSCULAR | Status: AC
  Administered 2018-07-07: 10 mg via INTRAMUSCULAR

## 2018-07-07 MED ORDER — NAPROXEN 500 MG PO TABS: 500.0000 mg | ORAL_TABLET | Freq: Two times a day (BID) | ORAL | 0 refills | Status: DC

## 2018-07-07 MED ORDER — METOCLOPRAMIDE HCL 5 MG/ML IJ SOLN
INTRAMUSCULAR | Status: AC
  Filled 2018-07-07: qty 2

## 2018-07-07 MED ORDER — DEXAMETHASONE SODIUM PHOSPHATE 10 MG/ML IJ SOLN
INTRAMUSCULAR | Status: AC
  Filled 2018-07-07: qty 1

## 2018-07-07 NOTE — ED Triage Notes (Signed)
C/o migraine for 1 week, also c/o abscess on pubis area

## 2018-07-07 NOTE — ED Provider Notes (Signed)
[place]    CSN: [id number] Arrival date & time: 07/07/18  1713     History   Chief Complaint Chief Complaint  Patient presents with   Migraine    HPI Michelle Thomas is a 43 y.o. female history of migraines, presenting today for evaluation of migraine and abscess.  Patient states that over the past week she has had a migraine that is been off and on.  Typically if she catches it early her headache is manageable.  She initially was attributing her headache to allergies, but is been persistent for the past week.  She has been taking Tylenol as well as Excedrin Migraine without relief.  Headache is associated with photophobia, nausea.  Headache has been mainly located in the right frontal region.  Denies any vision changes.  Headache similar to previous headaches/migraines.  She has previously been on Imitrex, but this was not helpful for her.  She is not on any preventative medicine.  Denies any head injury or fall.  Denies difficulty speaking or weakness.  Denies chest pain or shortness of breath.  She also notes that she has had an area to her pubic area over the past 5 days that she is concerned about an abscess.  States that it previously was more painful and tender and came to ahead and ruptured with pus.  This area has continued to feel slightly thickened and is concerned.  She has been applying warm compresses as well as tea tree oil.  States that she has had recurrent issues with this.  HPI  Past Medical History:  Diagnosis Date   Migraine without aura, without mention of intractable migraine without mention of status migrainosus 06/18/2012   Obesity     Patient Active Problem List   Diagnosis Date Noted   Migraine without aura, without mention of intractable migraine without mention of status migrainosus 06/18/2012    Past Surgical History:  Procedure Laterality Date   LAPAROSCOPY N/A 05/03/2018   Procedure: LAPAROSCOPY DIAGNOSTIC,;  Surgeon:  Arvella Nigh, MD;  Location: [facility];  Service: Gynecology;  Laterality: N/A;   OVARIAN CYST REMOVAL Left 05/03/2018   Procedure: LEFT OVARIAN CYSTECTOMY;  Surgeon: Arvella Nigh, MD;  Location: [facility];  Service: Gynecology;  Laterality: Left;   WISDOM TOOTH EXTRACTION      OB History   No obstetric history on file.      Home Medications    Prior to Admission medications   Medication Sig Start Date End Date Taking? Authorizing Provider  acetaminophen (TYLENOL) 500 MG tablet Take 1,000 mg by mouth every 6 (six) hours as needed.    [provider]  aspirin-acetaminophen-caffeine (EXCEDRIN MIGRAINE) [phone] MG per tablet Take 1 tablet by mouth every 6 (six) hours as needed for pain.    [provider]  naproxen (NAPROSYN) 500 MG tablet Take 1 tablet (500 mg total) by mouth 2 (two) times daily. 07/07/18   Carlisle Torgeson, Elesa Hacker, PA-C    Family History Family History  Problem Relation Age of Onset   Hypertension Father    Diabetes Father    Cancer Father        colon cancer   Headache Sister    Diabetes Sister     Social History Social History   Tobacco Use   Smoking status: Never Smoker   Smokeless tobacco: Never Used  Substance Use Topics   Alcohol use: Yes    Comment: occ   Drug use: No  Allergies   Codeine and Penicillins cross reactors   Review of Systems Review of Systems  Constitutional: Negative for fatigue and fever.  HENT: Negative for congestion, sinus pressure and sore throat.   Eyes: Positive for photophobia. Negative for pain and visual disturbance.  Respiratory: Negative for cough and shortness of breath.   Cardiovascular: Negative for chest pain.  Gastrointestinal: Positive for nausea. Negative for abdominal pain and vomiting.  Genitourinary: Negative for decreased urine volume and hematuria.  Musculoskeletal: Negative for myalgias, neck pain and neck stiffness.  Skin: Positive for  wound.  Neurological: Positive for headaches. Negative for dizziness, syncope, facial asymmetry, speech difficulty, weakness, light-headedness and numbness.     Physical Exam Triage Vital Signs ED Triage Vitals  Enc Vitals Group     BP 07/07/18 1722 (!) 135/96     Pulse Rate 07/07/18 1722 92     Resp 07/07/18 1722 18     Temp 07/07/18 1722 98.8 F (37.1 C)     Temp src --      SpO2 07/07/18 1722 100 %     Weight --      Height --      Head Circumference --      Peak Flow --      Pain Score 07/07/18 1724 8     Pain Loc --      Pain Edu? --      Excl. in [place]? --    No data found.  Updated Vital Signs BP (!) 135/96    Pulse 92    Temp 98.8 F (37.1 C)    Resp 18    LMP 06/16/2018    SpO2 100%   Visual Acuity Right Eye Distance:   Left Eye Distance:   Bilateral Distance:    Right Eye Near:   Left Eye Near:    Bilateral Near:     Physical Exam Vitals signs and nursing note reviewed.  Constitutional:      General: She is not in acute distress.    Appearance: She is well-developed.  HENT:     Head: Normocephalic and atraumatic.     Comments: No temporal nodularity    Ears:     Comments: Bilateral ears without tenderness to palpation of external auricle, tragus and mastoid, EAC's without erythema or swelling, TM's with good bony landmarks and cone of light. Non erythematous.    Mouth/Throat:     Comments: Oral mucosa pink and moist, no tonsillar enlargement or exudate. Posterior pharynx patent and nonerythematous, no uvula deviation or swelling. Normal phonation. Palate elevating symmetrically Eyes:     Extraocular Movements: Extraocular movements intact.     Conjunctiva/sclera: Conjunctivae normal.     Pupils: Pupils are equal, round, and reactive to light.  Neck:     Musculoskeletal: Neck supple.     Comments: Full active range of motion of neck, negative Kernig and Brudzinski, no carotid bruits auscultated Cardiovascular:     Rate and Rhythm: Normal rate and  regular rhythm.     Heart sounds: No murmur.  Pulmonary:     Effort: Pulmonary effort is normal. No respiratory distress.     Breath sounds: Normal breath sounds.     Comments: Breathing comfortably at rest, CTABL, no wheezing, rales or other adventitious sounds auscultated Abdominal:     Palpations: Abdomen is soft.     Tenderness: There is no abdominal tenderness.  Genitourinary:    Comments: Upper left mons with 1 cm area that appears as a  previous abscess, mild surrounding induration with central open area, no significant tenderness or erythema Skin:    General: Skin is warm and dry.  Neurological:     General: No focal deficit present.     Mental Status: She is alert and oriented to person, place, and time. Mental status is at baseline.     Comments: Patient A&O x3, cranial nerves II-XII grossly intact, strength at shoulders, hips and knees 5/5, equal bilaterally, patellar reflex 2+ bilaterally.Gait without abnormality.      UC Treatments / Results  Labs (all labs ordered are listed, but only abnormal results are displayed) Labs Reviewed - No data to display  EKG None  Radiology No results found.  Procedures Procedures (including critical care time)  Medications Ordered in UC Medications  ketorolac (TORADOL) injection 60 mg (has no administration in time range)  metoCLOPramide (REGLAN) injection 5 mg (has no administration in time range)  dexamethasone (DECADRON) injection 10 mg (has no administration in time range)    Initial Impression / Assessment and Plan / UC Course  I have reviewed the triage vital signs and the nursing notes.  Pertinent labs & imaging results that were available during my care of the patient were reviewed by me and considered in my medical decision making (see chart for details).     Patient likely with migraine, typical to previous headache.  No red flags.  No neuro deficits.  Vital signs stable.  Will provide Toradol, Reglan and Decadron  in clinic prior to discharge.  Will send home to use Naprosyn for further headache management.  Area and pubic area appears to be previous abscess/folliculitis.  Does not appear infected at this time given minimal erythema and mild tenderness.  Likely on the tail end of healing after previous rupture.  Will recommend continued warm compresses for this and to follow-up if developing increased redness, pain or swelling to the area.  Discussed strict return precautions. Patient verbalized understanding and is agreeable with plan.  Final Clinical Impressions(s) / UC Diagnoses   Final diagnoses:  Migraine without status migrainosus, not intractable, unspecified migraine type     Discharge Instructions     We gave you a shot of Toradol, Decadron and Reglan today for your headache.  They should begin working in 30 to 40 minutes You may continue to use Naprosyn twice daily with food for further headache management Please follow-up with primary care/neurology if having headaches more frequently than normal  Continue to apply warm compresses or soaking in a warm bathtub to further help soften skin, this does not appear actively infected at this time.`  Follow-up in emergency room if developing worsening headache, persistent headache, vision changes, lightheadedness, dizziness, weakness   ED Prescriptions    Medication Sig Dispense Auth. Provider   naproxen (NAPROSYN) 500 MG tablet Take 1 tablet (500 mg total) by mouth 2 (two) times daily. 30 tablet Tamlyn Sides, [place] C, PA-C     Controlled Substance Prescriptions [place] Controlled Substance Registry consulted? Not Applicable   Janith Lima, [place] 07/07/18 1757

## 2018-07-07 NOTE — ED Notes (Signed)
Patient verbalizes understanding of discharge instructions. Opportunity for questioning and answers were provided. Patient discharged from [facility] by RN.  

## 2018-07-07 NOTE — Discharge Instructions (Addendum)
We gave you a shot of Toradol, Decadron and Reglan today for your headache.  They should begin working in 30 to 40 minutes You may continue to use Naprosyn twice daily with food for further headache management Please follow-up with primary care/neurology if having headaches more frequently than normal  Continue to apply warm compresses or soaking in a warm bathtub to further help soften skin, this does not appear actively infected at this time.`  Follow-up in emergency room if developing worsening headache, persistent headache, vision changes, lightheadedness, dizziness, weakness

## 2018-07-21 DIAGNOSIS — Z6841 Body Mass Index (BMI) 40.0 and over, adult: Secondary | ICD-10-CM | POA: Diagnosis not present

## 2018-07-21 DIAGNOSIS — Z01419 Encounter for gynecological examination (general) (routine) without abnormal findings: Secondary | ICD-10-CM | POA: Diagnosis not present

## 2018-07-28 ENCOUNTER — Other Ambulatory Visit: Payer: Self-pay | Admitting: Obstetrics and Gynecology

## 2018-07-28 DIAGNOSIS — Z9189 Other specified personal risk factors, not elsewhere classified: Secondary | ICD-10-CM

## 2018-09-20 ENCOUNTER — Ambulatory Visit: Payer: 59 | Admitting: Podiatry

## 2018-09-20 ENCOUNTER — Encounter: Payer: Self-pay | Admitting: Podiatry

## 2018-09-20 ENCOUNTER — Ambulatory Visit (INDEPENDENT_AMBULATORY_CARE_PROVIDER_SITE_OTHER): Payer: 59

## 2018-09-20 ENCOUNTER — Other Ambulatory Visit: Payer: Self-pay

## 2018-09-20 ENCOUNTER — Other Ambulatory Visit: Payer: Self-pay | Admitting: Podiatry

## 2018-09-20 VITALS — BP 121/69 | HR 89 | Temp 97.1°F

## 2018-09-20 DIAGNOSIS — M67479 Ganglion, unspecified ankle and foot: Secondary | ICD-10-CM

## 2018-09-20 DIAGNOSIS — M778 Other enthesopathies, not elsewhere classified: Secondary | ICD-10-CM

## 2018-09-20 DIAGNOSIS — M67472 Ganglion, left ankle and foot: Secondary | ICD-10-CM

## 2018-09-20 DIAGNOSIS — M722 Plantar fascial fibromatosis: Secondary | ICD-10-CM

## 2018-09-20 MED ORDER — MELOXICAM 15 MG PO TABS: 15.0000 mg | ORAL_TABLET | Freq: Every day | ORAL | 1 refills | Status: DC

## 2018-09-20 MED ORDER — METHYLPREDNISOLONE 4 MG PO TBPK: ORAL_TABLET | ORAL | 0 refills | Status: DC

## 2018-09-22 NOTE — Progress Notes (Signed)
   Subjective: 43 y.o. female presenting today as a new patient with a chief complaint of left posterior heel pain that began a few months ago. She describes the pain as throbbing pain. Applying pressure to the foot causes sharp pain. She has not done anything to treat these symptoms.  She also notes a nodule located on the dorsal left foot that appeared about two months ago. She states she hit the area on something causing the nodule to appear. She reports associated pain of the area but states that gradually resolved. She denies any modifying factors and has not had any treatment for the symptoms. Patient is here for further evaluation and treatment.   Past Medical History:  Diagnosis Date  . Migraine without aura, without mention of intractable migraine without mention of status migrainosus 06/18/2012  . Obesity      Objective: Physical Exam General: The patient is alert and oriented x3 in no acute distress.  Dermatology: Palpable, non-adhered nodule noted to the left dorsal foot. Skin is warm, dry and supple bilateral lower extremities. Negative for open lesions or macerations bilateral.   Vascular: Dorsalis Pedis and Posterior Tibial pulses palpable bilateral.  Capillary fill time is immediate to all digits.  Neurological: Epicritic and protective threshold intact bilateral.   Musculoskeletal: Tenderness to palpation to the plantar aspect of the left heel along the plantar fascia. All other joints range of motion within normal limits bilateral. Strength 5/5 in all groups bilateral.   Radiographic exam: Normal osseous mineralization. Joint spaces preserved. No fracture/dislocation/boney destruction. No other soft tissue abnormalities or radiopaque foreign bodies.   Assessment: 1. Plantar fasciitis left foot 2. Ganglion cyst left dorsal foot   Plan of Care:  1. Patient evaluated. Xrays reviewed.   2. Injection of 0.5cc Celestone soluspan injected into the left plantar fascia.  3.  Rx for Medrol Dose Pak placed 4. Rx for Meloxicam ordered for patient. 5. Plantar fascial band(s) dispensed  6. Instructed patient regarding therapies and modalities at home to alleviate symptoms.  7. Pressure applied to cyst and cyst was resorbed into the foot.  8. Return to clinic in 4 weeks.    Goes by [employer]. Works for [employer].    Edrick Kins, DPM [facility]  Dr. Edrick Kins, DPM    [address], [place] [address]                Office [phone]  Fax [phone]

## 2018-10-06 ENCOUNTER — Other Ambulatory Visit: Payer: Self-pay | Admitting: Obstetrics and Gynecology

## 2018-10-06 DIAGNOSIS — Z9189 Other specified personal risk factors, not elsewhere classified: Secondary | ICD-10-CM

## 2018-10-18 ENCOUNTER — Ambulatory Visit: Payer: 59 | Admitting: Podiatry

## 2018-11-08 ENCOUNTER — Ambulatory Visit: Payer: 59 | Admitting: Podiatry

## 2018-11-08 ENCOUNTER — Other Ambulatory Visit: Payer: Self-pay

## 2018-11-08 ENCOUNTER — Encounter: Payer: Self-pay | Admitting: Podiatry

## 2018-11-08 DIAGNOSIS — M722 Plantar fascial fibromatosis: Secondary | ICD-10-CM

## 2018-11-08 DIAGNOSIS — M67479 Ganglion, unspecified ankle and foot: Secondary | ICD-10-CM | POA: Diagnosis not present

## 2018-11-08 MED ORDER — MELOXICAM 15 MG PO TABS: 15.0000 mg | ORAL_TABLET | Freq: Every day | ORAL | 1 refills | Status: DC

## 2018-11-08 MED ORDER — METHYLPREDNISOLONE 4 MG PO TBPK: ORAL_TABLET | ORAL | 0 refills | Status: DC

## 2018-11-10 NOTE — Progress Notes (Signed)
   Subjective: 43 y.o. female presenting today for follow up evaluation of severe left foot pain that flared up in the past couple of days. She states the shoes she wore this past weekend caused the pain to begin. She reports significant relief after receiving the injection at the last visit. There are no modifying factors noted. Patient is here for further evaluation and treatment.   Past Medical History:  Diagnosis Date  . Migraine without aura, without mention of intractable migraine without mention of status migrainosus 06/18/2012  . Obesity      Objective: Physical Exam General: The patient is alert and oriented x3 in no acute distress.  Dermatology: Palpable, non-adhered nodule noted to the left dorsal foot. Skin is warm, dry and supple bilateral lower extremities. Negative for open lesions or macerations bilateral.   Vascular: Dorsalis Pedis and Posterior Tibial pulses palpable bilateral.  Capillary fill time is immediate to all digits.  Neurological: Epicritic and protective threshold intact bilateral.   Musculoskeletal: Tenderness to palpation to the plantar aspect of the left heel along the plantar fascia. All other joints range of motion within normal limits bilateral. Strength 5/5 in all groups bilateral.   Assessment: 1. Plantar fasciitis left foot - recurrent  2. Ganglion cyst left dorsal foot - recurrent   Plan of Care:  1. Patient evaluated.   2. Injection of 0.5cc Celestone soluspan injected into the left plantar fascia.  3. Injection of 0.5 mLs Celestone Soluspan injected into the ganglion cyst of the left foot.  4. Prescription for Medrol Dose Pak provided to patient. 5. Prescription for Meloxicam provided to patient. 6. Continue using plantar fascial brace.   7. Pressure applied to cyst and cyst was resorbed into the foot.  8. Return to clinic as needed.    Goes by [employer]. Works for [employer].    Edrick Kins, DPM [facility]  Dr.  Edrick Kins, DPM    [address], [place] [address]                Office [phone]  Fax [phone]

## 2018-12-20 ENCOUNTER — Ambulatory Visit: Payer: 59 | Admitting: Podiatry

## 2018-12-20 ENCOUNTER — Other Ambulatory Visit: Payer: Self-pay

## 2018-12-20 DIAGNOSIS — M722 Plantar fascial fibromatosis: Secondary | ICD-10-CM

## 2018-12-20 MED ORDER — DICLOFENAC SODIUM 75 MG PO TBEC: 75.0000 mg | DELAYED_RELEASE_TABLET | Freq: Two times a day (BID) | ORAL | 1 refills | Status: DC

## 2018-12-20 NOTE — Patient Instructions (Signed)
Pre-Operative Instructions  Congratulations, you have decided to take an important step towards improving your quality of life.  You can be assured that the doctors and staff at [facility] will be with you every step of the way.  Here are some important things you should know:  1. Plan to be at the surgery center/hospital at least 1 (one) hour prior to your scheduled time, unless otherwise directed by the surgical center/hospital staff.  You must have a responsible adult accompany you, remain during the surgery and drive you home.  Make sure you have directions to the surgical center/hospital to ensure you arrive on time. 2. If you are having surgery at [facility] or [facility], you will need a copy of your medical history and physical form from your family physician within one month prior to the date of surgery. We will give you a form for your primary physician to complete.  3. We make every effort to accommodate the date you request for surgery.  However, there are times where surgery dates or times have to be moved.  We will contact you as soon as possible if a change in schedule is required.   4. No aspirin/ibuprofen for one week before surgery.  If you are on aspirin, any non-steroidal anti-inflammatory medications (Mobic, Aleve, Ibuprofen) should not be taken seven (7) days prior to your surgery.  You make take Tylenol for pain prior to surgery.  5. Medications - If you are taking daily heart and blood pressure medications, seizure, reflux, allergy, asthma, anxiety, pain or diabetes medications, make sure you notify the surgery center/hospital before the day of surgery so they can tell you which medications you should take or avoid the day of surgery. 6. No food or drink after midnight the night before surgery unless directed otherwise by surgical center/hospital staff. 7. No alcoholic beverages 24-hours prior to surgery.  No smoking 24-hours prior or 24-hours after  surgery. 8. Wear loose pants or shorts. They should be loose enough to fit over bandages, boots, and casts. 9. Don't wear slip-on shoes. Sneakers are preferred. 10. Bring your boot with you to the surgery center/hospital.  Also bring crutches or a walker if your physician has prescribed it for you.  If you do not have this equipment, it will be provided for you after surgery. 11. If you have not been contacted by the surgery center/hospital by the day before your surgery, call to confirm the date and time of your surgery. 12. Leave-time from work may vary depending on the type of surgery you have.  Appropriate arrangements should be made prior to surgery with your employer. 13. Prescriptions will be provided immediately following surgery by your doctor.  Fill these as soon as possible after surgery and take the medication as directed. Pain medications will not be refilled on weekends and must be approved by the doctor. 14. Remove nail polish on the operative foot and avoid getting pedicures prior to surgery. 15. Wash the night before surgery.  The night before surgery wash the foot and leg well with water and the antibacterial soap provided. Be sure to pay special attention to beneath the toenails and in between the toes.  Wash for at least three (3) minutes. Rinse thoroughly with water and dry well with a towel.  Perform this wash unless told not to do so by your physician.  Enclosed: 1 Ice pack (please put in freezer the night before surgery)   1 Hibiclens skin cleaner     Pre-op instructions  If you have any questions regarding the instructions, please do not hesitate to call our office.  [place]: [address], [place], [place] [address] -- [phone]  [place]: [address] [place], [place] [address] -- [phone]  [place]: [address]  [place], [place] [address] -- [phone]   Website: [email or web address] 

## 2018-12-24 NOTE — Progress Notes (Signed)
   Subjective: 43 y.o. female presenting today for follow up evaluation of plantar fasciitis and a ganglion cyst of the left foot. She states the plantar fascial pain has worsened but the ganglion cyst has improved significantly. She has taken the Medrol Dose Pak and Meloxicam with no significant relief. There are no modifying factors noted. Patient is here for further evaluation and treatment.   Past Medical History:  Diagnosis Date  . Migraine without aura, without mention of intractable migraine without mention of status migrainosus 06/18/2012  . Obesity      Objective: Physical Exam General: The patient is alert and oriented x3 in no acute distress.  Dermatology: Skin is warm, dry and supple bilateral lower extremities. Negative for open lesions or macerations bilateral.   Vascular: Dorsalis Pedis and Posterior Tibial pulses palpable bilateral.  Capillary fill time is immediate to all digits.  Neurological: Epicritic and protective threshold intact bilateral.   Musculoskeletal: Tenderness to palpation to the plantar aspect of the left heel along the plantar fascia. All other joints range of motion within normal limits bilateral. Strength 5/5 in all groups bilateral.   Assessment: 1. Plantar fasciitis left foot 2. Ganglion cyst left dorsal foot - resolved   Plan of Care:  1. Patient evaluated.    2. Injection of 0.5cc Celestone soluspan injected into the left plantar fascia.  3. Prescription for Diclofenac provided to patient. Discontinue taking Meloxicam.  4. Night splint dispensed.  5. Today we discussed the conservative versus surgical management of the presenting pathology. The patient opts for surgical management. All possible complications and details of the procedure were explained. All patient questions were answered. No guarantees were expressed or implied. 6. Authorization for surgery was initiated today. Surgery will consist of EPF left.  7. Return to clinic one week  post op.     Goes by [employer]. Works for [employer]. Also works part time at the hospital.    Edrick Kins, DPM [facility]  Dr. Edrick Kins, DPM    [address], [place] [address]                Office [phone]  Fax [phone]

## 2019-01-27 ENCOUNTER — Telehealth: Payer: Self-pay | Admitting: *Deleted

## 2019-01-27 NOTE — Telephone Encounter (Signed)
DOS 02/17/2019 ENDOSCOPIC PLANTAR FASCIOTOMY LEFT FOOT - 29893  UHC: Eligibility Date - 03/31/2018 - 03/31/2019  Plan Deductible Per Calendar Year $500.00 of $500.00 Met  Out-of-Pocket Maximum Per Calendar Year $3,205.89 of $5,000.00 Met Remaining: $1,794.11  Co-Insurance - 20%   Pre-cert is REQUIRED. Pending authorization number is [id number]. 

## 2019-02-01 NOTE — Telephone Encounter (Signed)
Covered/Approved - [id number]   1-1 Code  Description  Coverage Status DECISION DATE    [facility]  Coverage determination is reflected for the facility admission and is not a guarantee of payment for ongoing services. Covered/Approved 01/28/2019  1 [id number] Endoscopic plantar fasciotomy  Covered/Approved 01/28/2019

## 2019-02-17 ENCOUNTER — Other Ambulatory Visit: Payer: Self-pay | Admitting: Podiatry

## 2019-02-17 DIAGNOSIS — M722 Plantar fascial fibromatosis: Secondary | ICD-10-CM

## 2019-02-17 MED ORDER — HYDROMORPHONE HCL 4 MG PO TABS: 4.0000 mg | ORAL_TABLET | Freq: Four times a day (QID) | ORAL | 0 refills | Status: DC | PRN

## 2019-02-17 NOTE — Progress Notes (Signed)
PRN postop 

## 2019-02-18 ENCOUNTER — Telehealth: Payer: Self-pay | Admitting: Podiatry

## 2019-02-18 MED ORDER — PROMETHAZINE HCL 25 MG PO TABS: 25.0000 mg | ORAL_TABLET | Freq: Four times a day (QID) | ORAL | 0 refills | Status: DC | PRN

## 2019-02-18 NOTE — Telephone Encounter (Signed)
Patient called to say that the pain meds that Dr. Amalia Hailey gave to her are not helping and makes her really sick

## 2019-02-18 NOTE — Addendum Note (Signed)
Addended by: Harriett Sine D on: 02/18/2019 10:46 AM   Modules accepted: Orders

## 2019-02-18 NOTE — Telephone Encounter (Signed)
I called pt, she states the dilaudid is making her vomit. I told pt I would send a message to Dr. Amalia Hailey in [place] and to contact her pharmacy later today. I told pt I would also send phenergan for the nausea, take it about 30 minutes prior to the new pain medication and take the new pain medication with a light snack and ginger ale. Pt states understanding.

## 2019-02-23 ENCOUNTER — Ambulatory Visit (INDEPENDENT_AMBULATORY_CARE_PROVIDER_SITE_OTHER): Payer: 59 | Admitting: Podiatry

## 2019-02-23 ENCOUNTER — Other Ambulatory Visit: Payer: Self-pay

## 2019-02-23 DIAGNOSIS — M67479 Ganglion, unspecified ankle and foot: Secondary | ICD-10-CM

## 2019-02-23 DIAGNOSIS — M722 Plantar fascial fibromatosis: Secondary | ICD-10-CM

## 2019-02-26 ENCOUNTER — Encounter: Payer: Self-pay | Admitting: Podiatry

## 2019-02-26 NOTE — Progress Notes (Signed)
Subjective:  Patient ID: Michelle Thomas, female    DOB: 1975-07-01,  MRN: [id number]  Chief Complaint  Patient presents with  . Routine Post Op     pov#1 dos 11.19.2020 EPF Left, pt states that her foot is hurting, pt shows no signs of infection, pt also states that the pain meds she does have now, is causing her to feel nauseated    43 y.o. female returns for post-op check.  Patient is doing well.  She states that there is some pain around the incision site as expected postoperative changes.  She denies any nausea chills fevers vomiting.  She has been ambulated with the cam boot weightbearing as tolerated.  Patient states that she has been not taking the pain meds.  Review of Systems: Negative except as noted in the HPI. Denies N/V/F/Ch.  Past Medical History:  Diagnosis Date  . Migraine without aura, without mention of intractable migraine without mention of status migrainosus 06/18/2012  . Obesity     Current Outpatient Medications:  .  acetaminophen (TYLENOL) 500 MG tablet, Take 1,000 mg by mouth every 6 (six) hours as needed., Disp: , Rfl:  .  aspirin-acetaminophen-caffeine (EXCEDRIN MIGRAINE) 250-250-65 MG per tablet, Take 1 tablet by mouth every 6 (six) hours as needed for pain., Disp: , Rfl:  .  diclofenac (VOLTAREN) 75 MG EC tablet, Take 1 tablet (75 mg total) by mouth 2 (two) times daily., Disp: 60 tablet, Rfl: 1 .  fluconazole (DIFLUCAN) 150 MG tablet, TK 1 T PO AS NEEDED FOR 1 DAY., Disp: , Rfl:  .  HYDROmorphone (DILAUDID) 4 MG tablet, Take 1 tablet (4 mg total) by mouth every 6 (six) hours as needed for severe pain., Disp: 30 tablet, Rfl: 0 .  meloxicam (MOBIC) 15 MG tablet, Take 1 tablet (15 mg total) by mouth daily., Disp: 30 tablet, Rfl: 1 .  methylPREDNISolone (MEDROL DOSEPAK) 4 MG TBPK tablet, 6 day dose pack - take as directed, Disp: 21 tablet, Rfl: 0 .  naproxen (NAPROSYN) 500 MG tablet, Take 1 tablet (500 mg total) by mouth 2 (two) times daily., Disp: 30  tablet, Rfl: 0 .  promethazine (PHENERGAN) 25 MG tablet, Take 1 tablet (25 mg total) by mouth every 6 (six) hours as needed for nausea or vomiting., Disp: 20 tablet, Rfl: 0 .  valACYclovir (VALTREX) 500 MG tablet, valacyclovir 500 mg tablet  TAKE 1 TABLET BY MOUTH TWICE DAILY, Disp: , Rfl:   Social History   Tobacco Use  Smoking Status Never Smoker  Smokeless Tobacco Never Used    Allergies  Allergen Reactions  . Codeine     Rash  . Penicillins Cross Reactors Hives   Objective:  There were no vitals filed for this visit. There is no height or weight on file to calculate BMI. Constitutional Well developed. Well nourished.  Vascular Foot warm and well perfused. Capillary refill normal to all digits.   Neurologic Normal speech. Oriented to person, place, and time. Epicritic sensation to light touch grossly present bilaterally.  Dermatologic Skin healing well without signs of infection. Skin edges well coapted without signs of infection.  Orthopedic: Tenderness to palpation noted about the surgical site.   Radiographs: None Assessment:  No diagnosis found. Plan:  Patient was evaluated and treated and all questions answered.  S/p foot surgery left -Progressing as expected post-operatively. -XR: None -WB Status: Weightbearing as tolerated in cam boot -Sutures: Sutures are intact no signs of dehiscence noted..  Sutures will be removed during next  visit. -Medications: None -Foot redressed.  No follow-ups on file.

## 2019-03-02 ENCOUNTER — Encounter: Payer: Self-pay | Admitting: Podiatry

## 2019-03-02 ENCOUNTER — Ambulatory Visit (INDEPENDENT_AMBULATORY_CARE_PROVIDER_SITE_OTHER): Payer: 59 | Admitting: Podiatry

## 2019-03-02 ENCOUNTER — Other Ambulatory Visit: Payer: Self-pay

## 2019-03-02 DIAGNOSIS — Z9889 Other specified postprocedural states: Secondary | ICD-10-CM

## 2019-03-02 DIAGNOSIS — M67479 Ganglion, unspecified ankle and foot: Secondary | ICD-10-CM

## 2019-03-02 DIAGNOSIS — M722 Plantar fascial fibromatosis: Secondary | ICD-10-CM

## 2019-03-02 MED ORDER — HYDROCODONE-ACETAMINOPHEN 5-325 MG PO TABS: 1.0000 | ORAL_TABLET | Freq: Four times a day (QID) | ORAL | 0 refills | Status: DC | PRN

## 2019-03-03 ENCOUNTER — Encounter: Payer: Self-pay | Admitting: Podiatry

## 2019-03-06 NOTE — Progress Notes (Signed)
   Subjective:  Patient presents today status post EPF left. DOS: 02/17/2019. She states she has improved since her last visit. She reports mild soreness. She was taking the pain medication for treatment but states it caused abdominal issues. There are no modifying factors noted. She has been using the CAM boot as directed. Patient is here for further evaluation and treatment.    Past Medical History:  Diagnosis Date  . Migraine without aura, without mention of intractable migraine without mention of status migrainosus 06/18/2012  . Obesity       Objective/Physical Exam Neurovascular status intact.  Skin incisions appear to be well coapted with sutures and staples intact. No sign of infectious process noted. No dehiscence. No active bleeding noted. Moderate edema noted to the surgical extremity.  Assessment: 1. s/p EPF left. DOS: 02/17/2019   Plan of Care:  1. Patient was evaluated. 2. Sutures removed.  3. Prescription for Vicodin 5/325 mg provided to patient.  4. Continue weightbearing in CAM boot.  5. Compression anklet dispensed.  6. Return to clinic in 2 weeks.    Edrick Kins, DPM [facility]  Dr. Edrick Kins, [address]                                        [place], [place] [address]                Office [phone]  Fax [phone]

## 2019-03-16 ENCOUNTER — Other Ambulatory Visit: Payer: Self-pay

## 2019-03-16 ENCOUNTER — Ambulatory Visit (INDEPENDENT_AMBULATORY_CARE_PROVIDER_SITE_OTHER): Payer: 59 | Admitting: Podiatry

## 2019-03-16 DIAGNOSIS — Z9889 Other specified postprocedural states: Secondary | ICD-10-CM

## 2019-03-16 DIAGNOSIS — M722 Plantar fascial fibromatosis: Secondary | ICD-10-CM

## 2019-03-20 NOTE — Progress Notes (Signed)
   Subjective:  Patient presents today status post EPF left. DOS: 02/17/2019. She states she is doing well and improving. She reports some intermittent pain and states her incision site is still tender. She has been using the CAM boot as directed. There are no modifying factors noted. Patient is here for further evaluation and treatment.    Past Medical History:  Diagnosis Date  . Migraine without aura, without mention of intractable migraine without mention of status migrainosus 06/18/2012  . Obesity       Objective/Physical Exam Neurovascular status intact.  Skin incisions appear to be well coapted. No sign of infectious process noted. No dehiscence. No active bleeding noted. Moderate edema noted to the surgical extremity.  Assessment: 1. s/p EPF left. DOS: 02/17/2019   Plan of Care:  1. Patient was evaluated. 2. Discontinue using CAM boot.  3. Recommended good shoe gear.  4. Return to clinic in 6 weeks for final follow up visit.   Thinking of going to [place] for Christmas.    Edrick Kins, DPM [facility]  Dr. Edrick Kins, [address]                                        [place], [place] [address]                Office [phone]  Fax [phone]

## 2019-04-27 ENCOUNTER — Ambulatory Visit (INDEPENDENT_AMBULATORY_CARE_PROVIDER_SITE_OTHER): Payer: 59 | Admitting: Podiatry

## 2019-04-27 ENCOUNTER — Other Ambulatory Visit: Payer: Self-pay

## 2019-04-27 DIAGNOSIS — Z9889 Other specified postprocedural states: Secondary | ICD-10-CM

## 2019-04-27 DIAGNOSIS — M67479 Ganglion, unspecified ankle and foot: Secondary | ICD-10-CM | POA: Diagnosis not present

## 2019-04-27 DIAGNOSIS — M722 Plantar fascial fibromatosis: Secondary | ICD-10-CM

## 2019-04-30 NOTE — Progress Notes (Signed)
   Subjective:  Patient presents today status post EPF left. DOS: 02/17/2019. She reports continued improvement and states she is doing well. She reports some intermittent mild pain but denies worsening factors. She has been wearing good shoe gear as directed. Patient is here for further evaluation and treatment.    Past Medical History:  Diagnosis Date  . Migraine without aura, without mention of intractable migraine without mention of status migrainosus 06/18/2012  . Obesity       Objective/Physical Exam Neurovascular status intact.  Skin incisions appear to be well coapted. No sign of infectious process noted. No dehiscence. No active bleeding noted. Moderate edema noted to the surgical extremity. Fluctuant, non-adhered mass noted to the dorsum of the left foot.  Assessment: 1. s/p EPF left. DOS: 02/17/2019 2. Ganglion cyst left dorsal foot   Plan of Care:  1. Patient was evaluated. 2. Regarding EPF: May resume full activity with no restrictions.  3. Today we discussed the conservative versus surgical management of the presenting pathology. The patient opts for surgical management. All possible complications and details of the procedure were explained. All patient questions were answered. No guarantees were expressed or implied. 4. Authorization for surgery was initiated today. In-office surgery will consist of excision of ganglion cyst left.  5. Return to clinic morning of in-office surgery.   Edrick Kins, DPM [facility]  Dr. Edrick Kins, [address]                                        [place], [place] [address]                Office [phone]  Fax [phone]

## 2019-05-03 ENCOUNTER — Telehealth: Payer: Self-pay | Admitting: *Deleted

## 2019-05-03 NOTE — Telephone Encounter (Signed)
DOS 05/11/2019 EXC. [facility] LEFT FOOT - 28090  UHC: Eligibility Date - 04/01/2019 - 03/30/2020  Plan Deductible Per Calendar Year $0.00 of $500.00 Met Remaining: $500.00  Out-of-Pocket Maximum Per Calendar Year $41.50 of $5,000.00 Met Remaining: $4,958.50  Co-Insurance 20%  Co-pay $150.00 / visit  This [employer] plan does not currently require a prior authorization for these services. If you have general questions about the prior authorization requirements, please call us at [phone] or visit [email or web address] > Clinician Resources > Advance and Admission Notification Requirements. The number above acknowledges your notification. Please write this number down for future reference. Notification is not a guarantee of coverage or payment.  Decision ID #:[id number]

## 2019-05-11 ENCOUNTER — Other Ambulatory Visit: Payer: Self-pay

## 2019-05-11 ENCOUNTER — Ambulatory Visit (INDEPENDENT_AMBULATORY_CARE_PROVIDER_SITE_OTHER): Payer: 59 | Admitting: Podiatry

## 2019-05-11 ENCOUNTER — Encounter: Payer: Self-pay | Admitting: Podiatry

## 2019-05-11 VITALS — Temp 97.7°F | Resp 16

## 2019-05-11 DIAGNOSIS — M722 Plantar fascial fibromatosis: Secondary | ICD-10-CM

## 2019-05-11 DIAGNOSIS — M67479 Ganglion, unspecified ankle and foot: Secondary | ICD-10-CM

## 2019-05-11 NOTE — Progress Notes (Signed)
Patient presented this morning for surgical excision of a ganglionic cyst to the left dorsal foot.  This morning upon evaluation the cyst appears to have been resolved.  No fluctuance noted.  There is a small bone spur to the dorsal aspect of the foot however this is asymptomatic.  Decision was made this morning to not proceed with excision of the ganglion cyst since it appears that the cyst has resolved on its own and is currently asymptomatic.  Return to clinic as needed

## 2019-05-18 ENCOUNTER — Encounter: Payer: 59 | Admitting: Podiatry

## 2019-05-25 ENCOUNTER — Encounter: Payer: 59 | Admitting: Podiatry

## 2019-06-08 ENCOUNTER — Encounter: Payer: 59 | Admitting: Podiatry

## 2019-06-21 ENCOUNTER — Encounter (HOSPITAL_COMMUNITY): Payer: Self-pay

## 2019-06-21 ENCOUNTER — Emergency Department (HOSPITAL_COMMUNITY)
Admission: EM | Admit: 2019-06-21 | Discharge: 2019-06-21 | Disposition: A | Discharge status: Home / Self Care | Payer: 59 | Attending: Emergency Medicine | Admitting: Emergency Medicine

## 2019-06-21 ENCOUNTER — Other Ambulatory Visit: Payer: Self-pay

## 2019-06-21 ENCOUNTER — Ambulatory Visit (HOSPITAL_COMMUNITY)
Admission: EM | Admit: 2019-06-21 | Discharge: 2019-06-21 | Disposition: A | Discharge status: Home / Self Care | Payer: 59 | Source: Home / Self Care

## 2019-06-21 ENCOUNTER — Emergency Department (HOSPITAL_COMMUNITY): Payer: 59

## 2019-06-21 DIAGNOSIS — R479 Unspecified speech disturbances: Secondary | ICD-10-CM

## 2019-06-21 DIAGNOSIS — G43109 Migraine with aura, not intractable, without status migrainosus: Secondary | ICD-10-CM

## 2019-06-21 DIAGNOSIS — Z79899 Other long term (current) drug therapy: Secondary | ICD-10-CM | POA: Insufficient documentation

## 2019-06-21 DIAGNOSIS — R519 Headache, unspecified: Secondary | ICD-10-CM | POA: Diagnosis present

## 2019-06-21 DIAGNOSIS — M25511 Pain in right shoulder: Secondary | ICD-10-CM | POA: Diagnosis not present

## 2019-06-21 DIAGNOSIS — H539 Unspecified visual disturbance: Secondary | ICD-10-CM

## 2019-06-21 DIAGNOSIS — M546 Pain in thoracic spine: Secondary | ICD-10-CM | POA: Diagnosis not present

## 2019-06-21 DIAGNOSIS — G43019 Migraine without aura, intractable, without status migrainosus: Secondary | ICD-10-CM

## 2019-06-21 DIAGNOSIS — H538 Other visual disturbances: Secondary | ICD-10-CM | POA: Diagnosis not present

## 2019-06-21 LAB — COMPREHENSIVE METABOLIC PANEL WITH GFR
ALT: 19 U/L (ref 0–44)
AST: 20 U/L (ref 15–41)
Albumin: 3.7 g/dL (ref 3.5–5.0)
Alkaline Phosphatase: 86 U/L (ref 38–126)
Anion gap: 11 (ref 5–15)
BUN: 10 mg/dL (ref 6–20)
CO2: 22 mmol/L (ref 22–32)
Calcium: 9.1 mg/dL (ref 8.9–10.3)
Chloride: 105 mmol/L (ref 98–111)
Creatinine, Ser: 0.93 mg/dL (ref 0.44–1.00)
GFR calc Af Amer: >60 mL/min (ref >60)
GFR calc non Af Amer: >60 mL/min (ref >60)
Glucose, Bld: 81 mg/dL (ref 70–99)
Potassium: 4 mmol/L (ref 3.5–5.1)
Sodium: 138 mmol/L (ref 135–145)
Total Bilirubin: 0.7 mg/dL (ref 0.3–1.2)
Total Protein: 7.7 g/dL (ref 6.5–8.1)

## 2019-06-21 LAB — CBC
HCT: 40.8 % (ref 36.0–46.0)
Hemoglobin: 12.3 g/dL (ref 12.0–15.0)
MCH: 23.2 pg — ABNORMAL LOW (ref 26.0–34.0)
MCHC: 30.1 g/dL (ref 30.0–36.0)
MCV: 77 fL — ABNORMAL LOW (ref 80.0–100.0)
Platelets: 282 K/uL (ref 150–400)
RBC: 5.3 MIL/uL — ABNORMAL HIGH (ref 3.87–5.11)
RDW: 15.4 % (ref 11.5–15.5)
WBC: 7.1 K/uL (ref 4.0–10.5)
nRBC: 0 % (ref 0.0–0.2)

## 2019-06-21 LAB — I-STAT BETA HCG BLOOD, ED (MC, WL, AP ONLY): I-stat hCG, quantitative: <5 m[IU]/mL (ref <5)

## 2019-06-21 LAB — LIPASE, BLOOD: Lipase: 40 U/L (ref 11–51)

## 2019-06-21 MED ORDER — ONDANSETRON 4 MG PO TBDP
ORAL_TABLET | ORAL | Status: AC
  Filled 2019-06-21: qty 1

## 2019-06-21 MED ORDER — GADOBUTROL 1 MMOL/ML IV SOLN
10.0000 mL | Freq: Once | INTRAVENOUS | Status: AC | PRN
  Administered 2019-06-21: 10 mL via INTRAVENOUS

## 2019-06-21 MED ORDER — PROCHLORPERAZINE EDISYLATE 10 MG/2ML IJ SOLN
10.0000 mg | Freq: Once | INTRAMUSCULAR | Status: AC
  Administered 2019-06-21: 10 mg via INTRAVENOUS
  Filled 2019-06-21: qty 2

## 2019-06-21 MED ORDER — KETOROLAC TROMETHAMINE 30 MG/ML IJ SOLN
30.0000 mg | Freq: Once | INTRAMUSCULAR | Status: AC
  Administered 2019-06-21: 30 mg via INTRAVENOUS
  Filled 2019-06-21: qty 1

## 2019-06-21 MED ORDER — SODIUM CHLORIDE 0.9% FLUSH
3.0000 mL | Freq: Once | INTRAVENOUS | Status: AC
  Administered 2019-06-21: 3 mL via INTRAVENOUS

## 2019-06-21 MED ORDER — DIPHENHYDRAMINE HCL 50 MG/ML IJ SOLN
12.5000 mg | Freq: Once | INTRAMUSCULAR | Status: AC
  Administered 2019-06-21: 12.5 mg via INTRAVENOUS
  Filled 2019-06-21: qty 1

## 2019-06-21 MED ORDER — ACETAMINOPHEN 325 MG PO TABS
975.0000 mg | ORAL_TABLET | Freq: Once | ORAL | Status: AC
  Administered 2019-06-21: 975 mg via ORAL

## 2019-06-21 MED ORDER — NAPROXEN 500 MG PO TABS: 500.0000 mg | ORAL_TABLET | Freq: Two times a day (BID) | ORAL | 0 refills | Status: DC

## 2019-06-21 MED ORDER — ACETAMINOPHEN 325 MG PO TABS
ORAL_TABLET | ORAL | Status: AC
  Filled 2019-06-21: qty 3

## 2019-06-21 MED ORDER — ONDANSETRON 4 MG PO TBDP
4.0000 mg | ORAL_TABLET | Freq: Once | ORAL | Status: AC
  Administered 2019-06-21: 4 mg via ORAL

## 2019-06-21 NOTE — ED Notes (Signed)
Patient verbalizes understanding of discharge instructions. Opportunity for questioning and answers were provided. Armband removed by staff. Patient discharged from ED. Signature pad unavailable.  

## 2019-06-21 NOTE — ED Triage Notes (Signed)
Pt presents with ongoing migraine X 2 days; pt states it is giving her nausea and she had sensitivity to light.

## 2019-06-21 NOTE — Discharge Instructions (Addendum)
As discussed, your MRI was negative for any acute abnormalities.  I suspect you having a complicated migraine.  I am sending you home with naproxen.  You may take it twice a day as needed for pain.  I have also placed a referral for neurology to call you to schedule an appointment.  If you do not hear from them within the next week call to schedule an appointment.  Return to the ER if you develop speech changes, facial droop, unilateral weakness, or worsening of symptoms.

## 2019-06-21 NOTE — ED Provider Notes (Signed)
[facility] EMERGENCY DEPARTMENT Provider Note   CSN: [id number] Arrival date & time: 06/21/19  1145     History Chief Complaint  Patient presents with   Migraine    Michelle Thomas is a 44 y.o. female with a past medical history significant for history of migraines without aura and obesity who presents to the ED due to gradual onset of worsening right-sided throbbing headache x2 days. Pain located around right eye. Patient denies sudden onset and worse intensity at onset.  Rates her pain an 8/10.  Headache is associated with right blurry vision and photophobia.  She has a history of migraines however has not had a migraine in the past year.  Notes this migraine is different than her normal migraines.  She has tried Excedrin Migraine without relief.  She also admits to right facial tingling sensation.  She was evaluated at urgent care just prior to arrival and sent to the ED due to neurological symptoms associated with her headache.  Patient also notes on Sunday she was having difficulties finding words which has completely resolved. The speech difficulties occurred a day prior to onset of her headache.  Denies head injury.  Admits to associative nausea and one episode of nonbloody, nonbilious emesis that occurred just prior to arrival.  Denies unilateral weakness, current speech difficulties, fever, and chills.  History obtained from patient and past medical records. No interpreter used during encounter.      Past Medical History:  Diagnosis Date   Migraine without aura, without mention of intractable migraine without mention of status migrainosus 06/18/2012   Obesity     Patient Active Problem List   Diagnosis Date Noted   Migraine without aura, without mention of intractable migraine without mention of status migrainosus 06/18/2012    Past Surgical History:  Procedure Laterality Date   LAPAROSCOPY N/A 05/03/2018   Procedure: LAPAROSCOPY DIAGNOSTIC,;   Surgeon: Arvella Nigh, MD;  Location: [facility];  Service: Gynecology;  Laterality: N/A;   OVARIAN CYST REMOVAL Left 05/03/2018   Procedure: LEFT OVARIAN CYSTECTOMY;  Surgeon: Arvella Nigh, MD;  Location: [facility];  Service: Gynecology;  Laterality: Left;   WISDOM TOOTH EXTRACTION       OB History   No obstetric history on file.     Family History  Problem Relation Age of Onset   Hypertension Father    Diabetes Father    Cancer Father        colon cancer   Headache Sister    Diabetes Sister     Social History   Tobacco Use   Smoking status: Never Smoker   Smokeless tobacco: Never Used  Substance Use Topics   Alcohol use: Yes    Comment: occ   Drug use: No    Home Medications Prior to Admission medications   Medication Sig Start Date End Date Taking? Authorizing Provider  acetaminophen (TYLENOL) 500 MG tablet Take 1,000 mg by mouth every 6 (six) hours as needed.    [provider]  aspirin-acetaminophen-caffeine (EXCEDRIN MIGRAINE) [phone] MG per tablet Take 1 tablet by mouth every 6 (six) hours as needed for pain.    [provider]  diclofenac (VOLTAREN) 75 MG EC tablet Take 1 tablet (75 mg total) by mouth 2 (two) times daily. 12/20/18   Edrick Kins, DPM  ergocalciferol (VITAMIN D2) 1.25 MG (50000 UT) capsule ergocalciferol (vitamin D2) 1,250 mcg (50,000 unit) capsule    [provider]  fluconazole (DIFLUCAN)  150 MG tablet TK 1 T PO AS NEEDED FOR 1 DAY. 11/29/18   [provider]  HYDROcodone-acetaminophen (NORCO/VICODIN) 5-325 MG tablet Take 1 tablet by mouth every 6 (six) hours as needed for moderate pain. 03/02/19   Edrick Kins, DPM  HYDROmorphone (DILAUDID) 4 MG tablet Take 1 tablet (4 mg total) by mouth every 6 (six) hours as needed for severe pain. 02/17/19   Edrick Kins, DPM  meloxicam (MOBIC) 15 MG tablet Take 1 tablet (15 mg total) by mouth daily. 11/08/18   Edrick Kins, DPM   methylPREDNISolone (MEDROL DOSEPAK) 4 MG TBPK tablet 6 day dose pack - take as directed 11/08/18   Edrick Kins, DPM  naproxen (NAPROSYN) 500 MG tablet Take 1 tablet (500 mg total) by mouth 2 (two) times daily. 07/07/18   Wieters, Hallie C, PA-C  naproxen (NAPROSYN) 500 MG tablet Take 1 tablet (500 mg total) by mouth 2 (two) times daily. 06/21/19   Suzy Bouchard, PA-C  promethazine (PHENERGAN) 25 MG tablet Take 1 tablet (25 mg total) by mouth every 6 (six) hours as needed for nausea or vomiting. 02/18/19   Edrick Kins, DPM  scopolamine (TRANSDERM-SCOP, 1.5 MG,) 1 MG/3DAYS Transderm-Scop 1.5 mg transdermal patch (1 mg over 3 days)    [provider]  valACYclovir (VALTREX) 500 MG tablet valacyclovir 500 mg tablet  TAKE 1 TABLET BY MOUTH TWICE DAILY    [provider]    Allergies    Codeine and Penicillins cross reactors  Review of Systems   Review of Systems  Constitutional: Negative for chills and fever.  Eyes: Positive for photophobia and visual disturbance.  Respiratory: Negative for shortness of breath.   Cardiovascular: Negative for chest pain.  Gastrointestinal: Positive for nausea and vomiting. Negative for abdominal pain and diarrhea.  Musculoskeletal: Negative for back pain and neck pain.  Neurological: Positive for speech difficulty (resolved) and headaches.  All other systems reviewed and are negative.   Physical Exam Updated Vital Signs BP 139/81    Pulse 87    Temp 98.4 F (36.9 C) (Oral)    Resp 18    Ht 5\' 9"  (1.753 m)    Wt 131.5 kg    SpO2 100%    BMI 42.83 kg/m   Physical Exam Vitals and nursing note reviewed.  Constitutional:      General: She is not in acute distress.    Appearance: She is not toxic-appearing.  HENT:     Head: Normocephalic.  Eyes:     Extraocular Movements: Extraocular movements intact.     Pupils: Pupils are equal, round, and reactive to light.  Neck:     Comments: No meningismus.  Full range of motion of  neck. Cardiovascular:     Rate and Rhythm: Normal rate and regular rhythm.     Pulses: Normal pulses.     Heart sounds: Normal heart sounds. No murmur. No friction rub. No gallop.   Pulmonary:     Effort: Pulmonary effort is normal.     Breath sounds: Normal breath sounds.  Abdominal:     General: Abdomen is flat. Bowel sounds are normal. There is no distension.     Palpations: Abdomen is soft.     Tenderness: There is no abdominal tenderness. There is no guarding or rebound.  Musculoskeletal:     Cervical back: Neck supple.     Comments: Able to move all 4 extremities without difficulty.  Neurological:     General:  No focal deficit present.     Mental Status: She is alert.     Comments: Speech is clear, able to follow commands CN III-XII intact Normal strength in upper and lower extremities bilaterally including dorsiflexion and plantar flexion, strong and equal grip strength Sensation grossly intact throughout Moves extremities without ataxia, coordination intact No pronator drift     ED Results / Procedures / Treatments   Labs (all labs ordered are listed, but only abnormal results are displayed) Labs Reviewed  CBC - Abnormal; Notable for the following components:      Result Value   RBC 5.30 (*)    MCV 77.0 (*)    MCH 23.2 (*)    All other components within normal limits  LIPASE, BLOOD  COMPREHENSIVE METABOLIC PANEL  I-STAT BETA HCG BLOOD, ED ([facility], [facility], [facility] ONLY)    EKG None  Radiology MR Brain W and Wo Contrast  Result Date: 06/21/2019 CLINICAL DATA:  Headache EXAM: MRI HEAD WITHOUT AND WITH CONTRAST TECHNIQUE: Multiplanar, multiecho pulse sequences of the brain and surrounding structures were obtained without and with intravenous contrast. CONTRAST:  5mL GADAVIST GADOBUTROL 1 MMOL/ML IV SOLN COMPARISON:  None. FINDINGS: Brain: There is no acute infarction or intracranial hemorrhage. There is no intracranial mass, mass effect, or edema. There is no hydrocephalus or  extra-axial fluid collection. Minimal scattered small foci of T2 hyperintensity in the supratentorial white matter likely reflect nonspecific gliosis/demyelination typically of no clinical significance. No abnormal enhancement. Vascular: Major vessel flow voids at the skull base are preserved. Skull and upper cervical spine: Normal marrow signal is preserved. Sinuses/Orbits: Paranasal sinuses are aerated. Orbits are unremarkable. Other: Sella is unremarkable.  Mastoid air cells are clear. IMPRESSION: No significant abnormality. Electronically Signed   By: Macy Mis M.D.   On: 06/21/2019 15:56    Procedures Procedures (including critical care time)  Medications Ordered in ED Medications  sodium chloride flush (NS) 0.9 % injection 3 mL (3 mLs Intravenous Given 06/21/19 1712)  prochlorperazine (COMPAZINE) injection 10 mg (10 mg Intravenous Given 06/21/19 1448)  diphenhydrAMINE (BENADRYL) injection 12.5 mg (12.5 mg Intravenous Given 06/21/19 1446)  gadobutrol (GADAVIST) 1 MMOL/ML injection 10 mL (10 mLs Intravenous Contrast Given 06/21/19 1536)  ketorolac (TORADOL) 30 MG/ML injection 30 mg (30 mg Intravenous Given 06/21/19 1712)    ED Course  I have reviewed the triage vital signs and the nursing notes.  Pertinent labs & imaging results that were available during my care of the patient were reviewed by me and considered in my medical decision making (see chart for details).    MDM Rules/Calculators/A&P                     44 year old female presents the ED from urgent care for evaluation of right-sided headache x2 days.  Headache was preceded by speech difficulties.  Headache associated with visual disturbances of the right eye.  Vitals all within normal limits.  Patient is afebrile, not tachycardic or hypoxic.  Patient no acute distress and non-ill-appearing.  Physical exam reassuring.  Normal neurological exam.  No focal deficits appreciated on exam.  No meningismus.  Doubt meningitis.  Will  consult neurology for imaging recommendations.  Routine labs ordered at triage.  CBC reassuring with no leukocytosis.  CMP unremarkable with normal renal function and no electrolyte derangements.  Lipase normal at 40.  Pregnancy test negative.  Spoke to Dr. Rory Percy with neurology who recommends MRI brain with and without contrast.  Will consult Dr.  Rory Percy once results become available.  MRI personally reviewed which is negative for any acute abnormalities.  Will discuss results with Dr.Arora.  Suspect complicated migraine. Discussed case with Dr. Rory Percy and will treat as complicated migraine at this time. Will give Toradol and reassess.  5:47 PM reassessed patient at bedside who notes her headache has completely resolved after Toradol.  Will discharge patient with naproxen as needed if her headache returns.  Ambulatory neurology referral placed.  Advised patient to call neurology if she does not hear from them within the next week. Strict ED precautions discussed with patient. Patient states understanding and agrees to plan. Patient discharged home in no acute distress and stable vitals.  Final Clinical Impression(s) / ED Diagnoses Final diagnoses:  Complicated migraine    Rx / DC Orders ED Discharge Orders         Ordered    Ambulatory referral to Neurology    Comments: An appointment is requested in approximately:   06/21/19 1634    naproxen (NAPROSYN) 500 MG tablet  2 times daily     06/21/19 1749           Karie Kirks 06/21/19 1750    Little, Wenda Overland, MD 06/22/19 1442

## 2019-06-21 NOTE — ED Triage Notes (Signed)
Pt here for evaluation of migraine x 2 days. Endorses nausea and vomiting, blurry vision.

## 2019-06-21 NOTE — Discharge Instructions (Signed)
Given you have had changes in your headache symptoms with vision changes and issues with your speech on Sunday preceding this headache , I strongly recommend that you should have further evaluation in the emergency department with likely CT scan and then follow up care with a neurologist.

## 2019-06-21 NOTE — ED Provider Notes (Addendum)
[facility]    CSN: [id number] Arrival date & time: 06/21/19  1016      History   Chief Complaint Chief Complaint  Patient presents with  . Migraine    HPI Michelle Thomas is a 44 y.o. female.   Patient with history of migraine headache presents urgent care today for 2-day history of migraine headache.  She reports headache started Monday afternoon and is gradually increased in intensity to an 8 out of 10.  She describes throbbing right-sided pain.  She endorses eye tearing and blurry vision in the right eye.  Pain is not improved with Excedrin Migraine treatment.  Light makes the headache much worse.  She reports she has never had blurry vision with her migraine headaches.  She also endorses right-sided upper back and shoulder pain with subjective weakness.  She denies numbness or tingling in her extremities.  She has had associated nausea but no vomiting.  When questioned about speech difficulty she states on Sunday the day prior to her headache,  she was riding in a car back from [place] with a friend.  She reports for the duration of this car ride she had difficulty finding words both with recall and being able to say them at times.  She reports choking with her friend about " not even being drunk" she reports she has not had this issue since that car ride however this was very unusual for her.  He was not drinking at the time.  Overall her pain is reported is similar to previous headaches however the vision changes and speech difficulties are very new and because patient have some concern today.  Patient is not a smoker.  She denies recent palpitations.  Denies chest pain or shortness of breath at any time.     Past Medical History:  Diagnosis Date  . Migraine without aura, without mention of intractable migraine without mention of status migrainosus 06/18/2012  . Obesity     Patient Active Problem List   Diagnosis Date Noted  . Migraine without aura,  without mention of intractable migraine without mention of status migrainosus 06/18/2012    Past Surgical History:  Procedure Laterality Date  . LAPAROSCOPY N/A 05/03/2018   Procedure: LAPAROSCOPY DIAGNOSTIC,;  Surgeon: Arvella Nigh, MD;  Location: [facility];  Service: Gynecology;  Laterality: N/A;  . OVARIAN CYST REMOVAL Left 05/03/2018   Procedure: LEFT OVARIAN CYSTECTOMY;  Surgeon: Arvella Nigh, MD;  Location: [facility];  Service: Gynecology;  Laterality: Left;  . WISDOM TOOTH EXTRACTION      OB History   No obstetric history on file.      Home Medications    Prior to Admission medications   Medication Sig Start Date End Date Taking? Authorizing Provider  acetaminophen (TYLENOL) 500 MG tablet Take 1,000 mg by mouth every 6 (six) hours as needed.    [provider]  aspirin-acetaminophen-caffeine (EXCEDRIN MIGRAINE) [phone] MG per tablet Take 1 tablet by mouth every 6 (six) hours as needed for pain.    [provider]  diclofenac (VOLTAREN) 75 MG EC tablet Take 1 tablet (75 mg total) by mouth 2 (two) times daily. 12/20/18   Edrick Kins, DPM  ergocalciferol (VITAMIN D2) 1.25 MG (50000 UT) capsule ergocalciferol (vitamin D2) 1,250 mcg (50,000 unit) capsule    [provider]  fluconazole (DIFLUCAN) 150 MG tablet TK 1 T PO AS NEEDED FOR 1 DAY. 11/29/18   [provider]  HYDROcodone-acetaminophen (NORCO/VICODIN) 5-325  MG tablet Take 1 tablet by mouth every 6 (six) hours as needed for moderate pain. 03/02/19   Edrick Kins, DPM  HYDROmorphone (DILAUDID) 4 MG tablet Take 1 tablet (4 mg total) by mouth every 6 (six) hours as needed for severe pain. 02/17/19   Edrick Kins, DPM  meloxicam (MOBIC) 15 MG tablet Take 1 tablet (15 mg total) by mouth daily. 11/08/18   Edrick Kins, DPM  methylPREDNISolone (MEDROL DOSEPAK) 4 MG TBPK tablet 6 day dose pack - take as directed 11/08/18   Edrick Kins, DPM  naproxen (NAPROSYN)  500 MG tablet Take 1 tablet (500 mg total) by mouth 2 (two) times daily. 07/07/18   Wieters, Hallie C, PA-C  promethazine (PHENERGAN) 25 MG tablet Take 1 tablet (25 mg total) by mouth every 6 (six) hours as needed for nausea or vomiting. 02/18/19   Edrick Kins, DPM  scopolamine (TRANSDERM-SCOP, 1.5 MG,) 1 MG/3DAYS Transderm-Scop 1.5 mg transdermal patch (1 mg over 3 days)    [provider]  valACYclovir (VALTREX) 500 MG tablet valacyclovir 500 mg tablet  TAKE 1 TABLET BY MOUTH TWICE DAILY    [provider]    Family History Family History  Problem Relation Age of Onset  . Hypertension Father   . Diabetes Father   . Cancer Father        colon cancer  . Headache Sister   . Diabetes Sister     Social History Social History   Tobacco Use  . Smoking status: Never Smoker  . Smokeless tobacco: Never Used  Substance Use Topics  . Alcohol use: Yes    Comment: occ  . Drug use: No     Allergies   Codeine and Penicillins cross reactors   Review of Systems Review of Systems  Constitutional: Negative for chills and fever.  HENT: Negative.   Eyes: Positive for photophobia and visual disturbance. Negative for pain.  Respiratory: Negative for cough and shortness of breath.   Neurological: Positive for speech difficulty and headaches. Negative for facial asymmetry, weakness and numbness.     Physical Exam Triage Vital Signs ED Triage Vitals  Enc Vitals Group     BP 06/21/19 1042 108/75     Pulse Rate 06/21/19 1042 99     Resp 06/21/19 1042 16     Temp 06/21/19 1042 98.1 F (36.7 C)     Temp Source 06/21/19 1042 Oral     SpO2 06/21/19 1042 99 %     Weight --      Height --      Head Circumference --      Peak Flow --      Pain Score 06/21/19 1047 9     Pain Loc --      Pain Edu? --      Excl. in [place]? --    No data found.  Updated Vital Signs BP 108/75 (BP Location: Right Arm)   Pulse 99   Temp 98.1 F (36.7 C) (Oral)   Resp 16   SpO2 99%    Visual Acuity Right Eye Distance:   Left Eye Distance:   Bilateral Distance:    Right Eye Near:   Left Eye Near:    Bilateral Near:     Physical Exam Vitals and nursing note reviewed.  Constitutional:      General: She is not in acute distress.    Appearance: She is well-developed.     Comments: Patient seen on exam  table in dark room due to photophobia.  HENT:     Head: Normocephalic and atraumatic.     Comments: No tenderness along temporal region or palpable lesion. Eyes:     Extraocular Movements: Extraocular movements intact.     Conjunctiva/sclera: Conjunctivae normal.     Pupils: Pupils are equal, round, and reactive to light.  Cardiovascular:     Rate and Rhythm: Normal rate and regular rhythm.     Heart sounds: No murmur.  Pulmonary:     Effort: Pulmonary effort is normal. No respiratory distress.     Breath sounds: Normal breath sounds.  Musculoskeletal:     Cervical back: No rigidity or tenderness.     Comments: Tenderness along the right upper trapezius  Skin:    General: Skin is warm and dry.  Neurological:     Mental Status: She is alert.     Comments: Patient is alert and oriented x3.  Speech is fluent without stutter or stammer.  She has equal strength bilaterally and sensation is grossly intact bilaterally.  Cranial nerves are intact to confrontation II-XII.  Patient has good finger-to-nose coordination.  No abnormal gait.      UC Treatments / Results  Labs (all labs ordered are listed, but only abnormal results are displayed) Labs Reviewed - No data to display  EKG   Radiology No results found.  Procedures Procedures (including critical care time)  Medications Ordered in UC Medications  acetaminophen (TYLENOL) tablet 975 mg (975 mg Oral Given 06/21/19 1132)  ondansetron (ZOFRAN-ODT) disintegrating tablet 4 mg (4 mg Oral Given 06/21/19 1133)    Initial Impression / Assessment and Plan / UC Course  I have reviewed the triage vital signs  and the nursing notes.  Pertinent labs & imaging results that were available during my care of the patient were reviewed by me and considered in my medical decision making (see chart for details).  Chart review was conducted with previous visits for migraine headache and none of noted speech difficulty or visual changes.  Specifically chart from 06/2018 as well as January 2017 and July 2017.    #Headache #Vision changes #Speech problem-aphasia vs slurring Patient is a 44 year old with history of migraine headache presenting with migraine headache with vision changes and recent changes in speech.  We discussed that this headache does not appear to be her baseline migraine as there are vision changes and concern for aphasia-like symptoms on Sunday preceding his headache.  Given these changes we cannot safely say in the urgent care setting that this is a normal migraine for her.  Concern would be for mass vs possible TIA ves active brain bleed.  She is neurologically nonfocal today however given the history I do feel it is best that she has further evaluation at the emergency department for rule out of more serious condition. also believe that she will need neurology follow-up.  We discussed initiation of treatment options in urgent care and opted for Tylenol and Zofran as NSAID therapy would be contraindicated if there is a active brain bleed. -Patient is reluctant however does agree to have further evaluation at the emergency department as we discussed that I cannot rule out more serious conditions in the urgent care at this time and this will be the safest thing at this time.  Final Clinical Impressions(s) / UC Diagnoses   Final diagnoses:  Intractable migraine without aura and without status migrainosus  Vision changes  Speech problem     Discharge Instructions  Given you have had changes in your headache symptoms with vision changes and issues with your speech on Sunday preceding this  headache , I strongly recommend that you should have further evaluation in the emergency department with likely CT scan and then follow up care with a neurologist.    ED Prescriptions    None     PDMP not reviewed this encounter.   Purnell Shoemaker, PA-C 06/21/19 1158    Almalik Weissberg, Marguerita Beards, PA-C 06/21/19 1159    Maudy Yonan, Marguerita Beards, PA-C 06/21/19 1301

## 2019-08-12 ENCOUNTER — Ambulatory Visit: Payer: 59 | Admitting: Neurology

## 2019-08-12 ENCOUNTER — Encounter: Payer: Self-pay | Admitting: Neurology

## 2019-08-12 ENCOUNTER — Other Ambulatory Visit: Payer: Self-pay

## 2019-08-12 VITALS — BP 132/86 | HR 77 | Ht 70.0 in | Wt 302.0 lb

## 2019-08-12 DIAGNOSIS — G43009 Migraine without aura, not intractable, without status migrainosus: Secondary | ICD-10-CM

## 2019-08-12 MED ORDER — TOPIRAMATE 25 MG PO TABS: ORAL_TABLET | ORAL | 3 refills | Status: DC

## 2019-08-12 MED ORDER — RIZATRIPTAN BENZOATE 10 MG PO TABS: 10.0000 mg | ORAL_TABLET | Freq: Three times a day (TID) | ORAL | 3 refills | Status: DC | PRN

## 2019-08-12 NOTE — Patient Instructions (Signed)
We will start topamax for the headache prevention, may take excedrine migraine and/or maxalt if needed for the headache.  Topamax (topiramate) is a seizure medication that has an FDA approval for seizures and for migraine headache. Potential side effects of this medication include weight loss, cognitive slowing, tingling in the fingers and toes, and carbonated drinks will taste bad. If any significant side effects are noted on this drug, please contact our office.

## 2019-08-12 NOTE — Progress Notes (Signed)
Reason for visit: Migraine headache  Referring physician: [facility]  Michelle Thomas is a 44 y.o. female  History of present illness:  Michelle Thomas is a 44 year old right-handed black female who has been seen through this office for migraine headaches in the past.  She returns with similar problems, she has been doing quite well with her headaches having a minor headache every 2 months or so, usually treated with Excedrin Migraine.  Over the last month or so, her headaches have converted to becoming much more frequent, she is now having 2-3 headaches a week, sometimes the headaches are quite severe.  She went to the emergency room on 21 June 2019 with a severe headache associated with cognitive clouding, word finding problems and some slurred speech.  She also had some tingling sensations on the right side of the face but nowhere else.  She had no weakness of the extremities or walking problems.  MRI of the brain was done and did not show evidence of an acute stroke.  The patient has noted that the Excedrin Migraine is no longer as effective as it was.  She has not had any further events of tingling or slurred speech.  The patient indicates that her headaches are unilateral usually and may be on the right or left side usually in the front.  She may have some photophobia and phonophobia and when the headaches are severe she may have some nausea.  She does have some neck stiffness at times.  She reports that her sister also has headache.  She has noted in the past that stress, lack of sleep, or missing a meal may activate the headache.  She is sent to this office for further evaluation.  Past Medical History:  Diagnosis Date  . Migraine without aura, without mention of intractable migraine without mention of status migrainosus 06/18/2012  . Obesity     Past Surgical History:  Procedure Laterality Date  . LAPAROSCOPY N/A 05/03/2018   Procedure: LAPAROSCOPY DIAGNOSTIC,;  Surgeon: Arvella Nigh, MD;  Location: [facility];  Service: Gynecology;  Laterality: N/A;  . OVARIAN CYST REMOVAL Left 05/03/2018   Procedure: LEFT OVARIAN CYSTECTOMY;  Surgeon: Arvella Nigh, MD;  Location: [place];  Service: Gynecology;  Laterality: Left;  . WISDOM TOOTH EXTRACTION      Family History  Problem Relation Age of Onset  . Hypertension Father   . Diabetes Father   . Cancer Father        colon cancer  . Headache Sister   . Diabetes Sister     Social history:  reports that she has never smoked. She has never used smokeless tobacco. She reports current alcohol use. She reports that she does not use drugs.  Medications:  Prior to Admission medications   Medication Sig Start Date End Date Taking? Authorizing Provider  acetaminophen (TYLENOL) 500 MG tablet Take 1,000 mg by mouth every 6 (six) hours as needed.   Yes [provider]  aspirin-acetaminophen-caffeine (EXCEDRIN MIGRAINE) [phone] MG per tablet Take 1 tablet by mouth every 6 (six) hours as needed for pain.   Yes [provider]  diclofenac (VOLTAREN) 75 MG EC tablet Take 1 tablet (75 mg total) by mouth 2 (two) times daily. 12/20/18  Yes Edrick Kins, DPM  fluconazole (DIFLUCAN) 150 MG tablet Take 150 mg by mouth daily.   Yes [provider]  scopolamine (TRANSDERM-SCOP, 1.5 MG,) 1 MG/3DAYS Transderm-Scop 1.5 mg transdermal patch (1 mg  over 3 days)   Yes [provider]  valACYclovir (VALTREX) 500 MG tablet valacyclovir 500 mg tablet  TAKE 1 TABLET BY MOUTH TWICE DAILY   Yes [provider]  ergocalciferol (VITAMIN D2) 1.25 MG (50000 UT) capsule ergocalciferol (vitamin D2) 1,250 mcg (50,000 unit) capsule    [provider]      Allergies  Allergen Reactions  . Codeine     Rash  . Penicillins Cross Reactors Hives    ROS:  Out of a complete 14 system review of symptoms, the patient complains only of the following symptoms, and all other  reviewed systems are negative.  Headache Neck stiffness  Blood pressure 132/86, pulse 77, height 5\' 10"  (1.778 m), weight (!) 302 lb (137 kg).  Physical Exam  General: The patient is alert and cooperative at the time of the examination.  The patient is markedly obese.  Eyes: Pupils are equal, round, and reactive to light. Discs are flat bilaterally.  Neck: The neck is supple, no carotid bruits are noted.  Respiratory: The respiratory examination is clear.  Cardiovascular: The cardiovascular examination reveals a regular rate and rhythm, no obvious murmurs or rubs are noted.  Skin: Extremities are without significant edema.  Neurologic Exam  Mental status: The patient is alert and oriented x 3 at the time of the examination. The patient has apparent normal recent and remote memory, with an apparently normal attention span and concentration ability.  Cranial nerves: Facial symmetry is present. There is good sensation of the face to pinprick and soft touch bilaterally. The strength of the facial muscles and the muscles to head turning and shoulder shrug are normal bilaterally. Speech is well enunciated, no aphasia or dysarthria is noted. Extraocular movements are full. Visual fields are full. The tongue is midline, and the patient has symmetric elevation of the soft palate. No obvious hearing deficits are noted.  Motor: The motor testing reveals 5 over 5 strength of all 4 extremities. Good symmetric motor tone is noted throughout.  Sensory: Sensory testing is intact to pinprick, soft touch, vibration sensation, and position sense on all 4 extremities. No evidence of extinction is noted.  Coordination: Cerebellar testing reveals good finger-nose-finger and heel-to-shin bilaterally.  Gait and station: Gait is normal. Tandem gait is normal. Romberg is negative. No drift is seen.  Reflexes: Deep tendon reflexes are symmetric and normal bilaterally. Toes are downgoing bilaterally.   MRI  brain 06/21/19:  IMPRESSION: No significant abnormality.  * MRI scan images were reviewed online. I agree with the written report.  MRI did show minimal white matter changes, may be related to history of migraine.    Assessment/Plan:  1.  Migraine headache  The patient has had conversion of the headache to a much more frequent pattern.  The patient will be placed on Topamax working up to 75 mg at night.  The patient will be given Maxalt to take if needed, she can take Excedrin Migraine as well.  She will follow-up here in 3 months.  She indicates that in the past she did not tolerate Imitrex well.  She will call if she has troubles with any of her medications.  Jill Alexanders MD 08/12/2019 8:02 AM  [facility] [address] [place], [place] [address]  Phone [phone] Fax [phone]

## 2019-09-13 ENCOUNTER — Other Ambulatory Visit: Payer: Self-pay | Admitting: Obstetrics and Gynecology

## 2019-09-13 DIAGNOSIS — Z9189 Other specified personal risk factors, not elsewhere classified: Secondary | ICD-10-CM

## 2019-10-27 ENCOUNTER — Other Ambulatory Visit: Payer: Self-pay | Admitting: Family Medicine

## 2019-10-27 DIAGNOSIS — E042 Nontoxic multinodular goiter: Secondary | ICD-10-CM

## 2019-11-04 ENCOUNTER — Other Ambulatory Visit: Payer: Self-pay

## 2019-11-04 ENCOUNTER — Ambulatory Visit
Admission: RE | Admit: 2019-11-04 | Discharge: 2019-11-04 | Disposition: A | Discharge status: Home / Self Care | Payer: 59 | Source: Ambulatory Visit | Attending: Family Medicine | Admitting: Family Medicine

## 2019-11-04 DIAGNOSIS — E042 Nontoxic multinodular goiter: Secondary | ICD-10-CM

## 2019-11-09 ENCOUNTER — Ambulatory Visit: Payer: 59 | Admitting: Neurology

## 2020-01-15 ENCOUNTER — Encounter (HOSPITAL_COMMUNITY): Payer: Self-pay | Admitting: *Deleted

## 2020-01-15 ENCOUNTER — Other Ambulatory Visit: Payer: Self-pay

## 2020-01-15 ENCOUNTER — Ambulatory Visit (HOSPITAL_COMMUNITY)
Admission: EM | Admit: 2020-01-15 | Discharge: 2020-01-15 | Disposition: A | Discharge status: Home / Self Care | Payer: 59 | Attending: Physician Assistant | Admitting: Physician Assistant

## 2020-01-15 DIAGNOSIS — G43909 Migraine, unspecified, not intractable, without status migrainosus: Secondary | ICD-10-CM

## 2020-01-15 MED ORDER — NAPROXEN 500 MG PO TABS: 500.0000 mg | ORAL_TABLET | Freq: Two times a day (BID) | ORAL | 0 refills | Status: DC

## 2020-01-15 MED ORDER — DEXAMETHASONE SODIUM PHOSPHATE 10 MG/ML IJ SOLN
INTRAMUSCULAR | Status: AC
  Filled 2020-01-15: qty 1

## 2020-01-15 MED ORDER — KETOROLAC TROMETHAMINE 60 MG/2ML IM SOLN
INTRAMUSCULAR | Status: AC
  Filled 2020-01-15: qty 2

## 2020-01-15 MED ORDER — METOCLOPRAMIDE HCL 5 MG/ML IJ SOLN
5.0000 mg | Freq: Once | INTRAMUSCULAR | Status: AC
  Administered 2020-01-15: 5 mg via INTRAMUSCULAR

## 2020-01-15 MED ORDER — DEXAMETHASONE SODIUM PHOSPHATE 10 MG/ML IJ SOLN
10.0000 mg | Freq: Once | INTRAMUSCULAR | Status: AC
  Administered 2020-01-15: 10 mg via INTRAMUSCULAR

## 2020-01-15 MED ORDER — METOCLOPRAMIDE HCL 5 MG/ML IJ SOLN
INTRAMUSCULAR | Status: AC
  Filled 2020-01-15: qty 2

## 2020-01-15 MED ORDER — KETOROLAC TROMETHAMINE 60 MG/2ML IM SOLN
60.0000 mg | Freq: Once | INTRAMUSCULAR | Status: AC
  Administered 2020-01-15: 60 mg via INTRAMUSCULAR

## 2020-01-15 NOTE — ED Triage Notes (Signed)
C/O onset typical migraine yesterday; reports hx migraines with same quality.  C/O nausea and vomiting, photosensitivity.  Has been taking Tyl ES, Excedrin Migraine without relief.

## 2020-01-15 NOTE — Discharge Instructions (Signed)
Follow up with neurologist Go to Emergency department with ANY worsening symptoms.

## 2020-01-15 NOTE — ED Provider Notes (Signed)
[place]    CSN: [id number] Arrival date & time: 01/15/20  1043      History   Chief Complaint Chief Complaint  Patient presents with  . Migraine    HPI Michelle Thomas is a 44 y.o. female.   Patient here c/w "migraine" x 2 days.  Seen here on 05/2019 for same, sent to ED at that time, had MRI done which was "normal".  She saw neurologist on 07/2019 and was started on Topamax, has not been back since.  HA located behind R eye, does not radiate.  Admits photophobia, denies vision changes, n/t, weakness.  HA is similar to previous HA, she has taken Excedrin and tylenol w/o relief.       Past Medical History:  Diagnosis Date  . Migraine without aura, without mention of intractable migraine without mention of status migrainosus 06/18/2012  . Obesity     Patient Active Problem List   Diagnosis Date Noted  . Migraine without aura 06/18/2012    Past Surgical History:  Procedure Laterality Date  . LAPAROSCOPY N/A 05/03/2018   Procedure: LAPAROSCOPY DIAGNOSTIC,;  Surgeon: Arvella Nigh, MD;  Location: [facility];  Service: Gynecology;  Laterality: N/A;  . OVARIAN CYST REMOVAL Left 05/03/2018   Procedure: LEFT OVARIAN CYSTECTOMY;  Surgeon: Arvella Nigh, MD;  Location: [place];  Service: Gynecology;  Laterality: Left;  . WISDOM TOOTH EXTRACTION      OB History   No obstetric history on file.      Home Medications    Prior to Admission medications   Medication Sig Start Date End Date Taking? Authorizing Provider  acetaminophen (TYLENOL) 500 MG tablet Take 1,000 mg by mouth every 6 (six) hours as needed.    [provider]  aspirin-acetaminophen-caffeine (EXCEDRIN MIGRAINE) [phone] MG per tablet Take 1 tablet by mouth every 6 (six) hours as needed for pain.    [provider]  diclofenac (VOLTAREN) 75 MG EC tablet Take 1 tablet (75 mg total) by mouth 2 (two) times daily. 12/20/18   Edrick Kins, DPM    ergocalciferol (VITAMIN D2) 1.25 MG (50000 UT) capsule ergocalciferol (vitamin D2) 1,250 mcg (50,000 unit) capsule    [provider]  fluconazole (DIFLUCAN) 150 MG tablet Take 150 mg by mouth daily.    [provider]  naproxen (NAPROSYN) 500 MG tablet Take 1 tablet (500 mg total) by mouth 2 (two) times daily. 01/15/20   Peri Jefferson, PA-C  rizatriptan (MAXALT) 10 MG tablet Take 1 tablet (10 mg total) by mouth 3 (three) times daily as needed for migraine. 08/12/19   Kathrynn Ducking, MD  scopolamine (TRANSDERM-SCOP, 1.5 MG,) 1 MG/3DAYS Transderm-Scop 1.5 mg transdermal patch (1 mg over 3 days)    [provider]  topiramate (TOPAMAX) 25 MG tablet Take one tablet at night for one week, then take 2 tablets at night for one week, then take 3 tablets at night. 08/12/19   Kathrynn Ducking, MD  valACYclovir (VALTREX) 500 MG tablet valacyclovir 500 mg tablet  TAKE 1 TABLET BY MOUTH TWICE DAILY    [provider]    Family History Family History  Problem Relation Age of Onset  . Hypertension Father   . Diabetes Father   . Cancer Father        colon cancer  . Headache Sister   . Diabetes Sister     Social History Social History   Tobacco Use  . Smoking status: Never  Smoker  . Smokeless tobacco: Never Used  Vaping Use  . Vaping Use: Never used  Substance Use Topics  . Alcohol use: Yes    Comment: occasionally  . Drug use: No     Allergies   Codeine and Penicillins cross reactors   Review of Systems Review of Systems  Eyes: Positive for photophobia. Negative for visual disturbance.  Gastrointestinal: Negative for nausea and vomiting.  Musculoskeletal: Negative for arthralgias, back pain, myalgias and neck pain.  Skin: Negative for color change.  Neurological: Positive for headaches. Negative for dizziness, syncope, facial asymmetry, speech difficulty, weakness, light-headedness and numbness.  Hematological: Negative for adenopathy. Does  not bruise/bleed easily.     Physical Exam Triage Vital Signs ED Triage Vitals  Enc Vitals Group     BP 01/15/20 1246 140/87     Pulse Rate 01/15/20 1247 80     Resp 01/15/20 1247 16     Temp 01/15/20 1246 99.1 F (37.3 C)     Temp Source 01/15/20 1246 Oral     SpO2 01/15/20 1247 98 %     Weight --      Height --      Head Circumference --      Peak Flow --      Pain Score 01/15/20 1247 9     Pain Loc --      Pain Edu? --      Excl. in [place]? --    No data found.  Updated Vital Signs BP 140/87   Pulse 80   Temp 99.1 F (37.3 C) (Oral)   Resp 16   LMP 12/19/2019 (Approximate)   SpO2 98%   Visual Acuity Right Eye Distance:   Left Eye Distance:   Bilateral Distance:    Right Eye Near:   Left Eye Near:    Bilateral Near:     Physical Exam Vitals and nursing note reviewed.  Constitutional:      General: She is not in acute distress.    Appearance: She is well-developed.  HENT:     Head: Normocephalic and atraumatic.  Eyes:     Conjunctiva/sclera: Conjunctivae normal.  Cardiovascular:     Rate and Rhythm: Normal rate and regular rhythm.     Heart sounds: No murmur heard.   Pulmonary:     Effort: Pulmonary effort is normal. No respiratory distress.     Breath sounds: Normal breath sounds.  Abdominal:     Tenderness: There is no abdominal tenderness.  Musculoskeletal:     Cervical back: Neck supple.  Skin:    General: Skin is warm and dry.     Capillary Refill: Capillary refill takes less than 2 seconds.  Neurological:     General: No focal deficit present.     Mental Status: She is alert and oriented to person, place, and time.     GCS: GCS eye subscore is 4. GCS verbal subscore is 5. GCS motor subscore is 6.     Cranial Nerves: Cranial nerves are intact. No cranial nerve deficit.     Motor: No weakness, abnormal muscle tone or pronator drift.     Gait: Gait normal.     Deep Tendon Reflexes:     Reflex Scores:      Patellar reflexes are 2+ on the  right side and 2+ on the left side. Psychiatric:        Mood and Affect: Mood normal.        Behavior: Behavior normal.  UC Treatments / Results  Labs (all labs ordered are listed, but only abnormal results are displayed) Labs Reviewed - No data to display  EKG   Radiology No results found.  Procedures Procedures (including critical care time)  Medications Ordered in UC Medications  ketorolac (TORADOL) injection 60 mg (60 mg Intramuscular Given 01/15/20 1328)  metoCLOPramide (REGLAN) injection 5 mg (5 mg Intramuscular Given 01/15/20 1327)  dexamethasone (DECADRON) injection 10 mg (10 mg Intramuscular Given 01/15/20 1327)    Initial Impression / Assessment and Plan / UC Course  I have reviewed the triage vital signs and the nursing notes.  Pertinent labs & imaging results that were available during my care of the patient were reviewed by me and considered in my medical decision making (see chart for details).      No focal deficits, no red flag sx present.  Migraine similar to previous ones, vitals stable. Will DC home. Follow up with neurologist Strict ED precautions provided Final Clinical Impressions(s) / UC Diagnoses   Final diagnoses:  Migraine without status migrainosus, not intractable, unspecified migraine type     Discharge Instructions     Follow up with neurologist Go to Emergency department with ANY worsening symptoms.    ED Prescriptions    Medication Sig Dispense Auth. Provider   naproxen (NAPROSYN) 500 MG tablet Take 1 tablet (500 mg total) by mouth 2 (two) times daily. 30 tablet Peri Jefferson, PA-C     PDMP not reviewed this encounter.   Peri Jefferson, PA-C 01/15/20 1340

## 2020-02-06 LAB — HM COLONOSCOPY

## 2020-07-05 ENCOUNTER — Other Ambulatory Visit: Payer: Self-pay | Admitting: Obstetrics and Gynecology

## 2020-07-05 DIAGNOSIS — N6320 Unspecified lump in the left breast, unspecified quadrant: Secondary | ICD-10-CM

## 2020-08-13 ENCOUNTER — Other Ambulatory Visit: Payer: Self-pay | Admitting: Obstetrics and Gynecology

## 2020-08-13 ENCOUNTER — Other Ambulatory Visit: Payer: Self-pay

## 2020-08-13 ENCOUNTER — Ambulatory Visit
Admission: RE | Admit: 2020-08-13 | Discharge: 2020-08-13 | Disposition: A | Discharge status: Home / Self Care | Payer: 59 | Source: Ambulatory Visit | Attending: Obstetrics and Gynecology | Admitting: Obstetrics and Gynecology

## 2020-08-13 DIAGNOSIS — N6320 Unspecified lump in the left breast, unspecified quadrant: Secondary | ICD-10-CM

## 2020-09-24 ENCOUNTER — Ambulatory Visit (HOSPITAL_COMMUNITY)
Admission: EM | Admit: 2020-09-24 | Discharge: 2020-09-24 | Disposition: A | Discharge status: Home / Self Care | Payer: 59 | Attending: Emergency Medicine | Admitting: Emergency Medicine

## 2020-09-24 ENCOUNTER — Encounter (HOSPITAL_COMMUNITY): Payer: Self-pay

## 2020-09-24 DIAGNOSIS — Z20822 Contact with and (suspected) exposure to covid-19: Secondary | ICD-10-CM | POA: Insufficient documentation

## 2020-09-24 DIAGNOSIS — R051 Acute cough: Secondary | ICD-10-CM | POA: Diagnosis present

## 2020-09-24 DIAGNOSIS — R030 Elevated blood-pressure reading, without diagnosis of hypertension: Secondary | ICD-10-CM | POA: Diagnosis not present

## 2020-09-24 DIAGNOSIS — Z791 Long term (current) use of non-steroidal anti-inflammatories (NSAID): Secondary | ICD-10-CM | POA: Insufficient documentation

## 2020-09-24 DIAGNOSIS — Z79899 Other long term (current) drug therapy: Secondary | ICD-10-CM | POA: Insufficient documentation

## 2020-09-24 DIAGNOSIS — J069 Acute upper respiratory infection, unspecified: Secondary | ICD-10-CM | POA: Diagnosis not present

## 2020-09-24 DIAGNOSIS — J029 Acute pharyngitis, unspecified: Secondary | ICD-10-CM | POA: Insufficient documentation

## 2020-09-24 DIAGNOSIS — Z880 Allergy status to penicillin: Secondary | ICD-10-CM | POA: Insufficient documentation

## 2020-09-24 DIAGNOSIS — Z885 Allergy status to narcotic agent status: Secondary | ICD-10-CM | POA: Diagnosis not present

## 2020-09-24 LAB — POCT RAPID STREP A, ED / UC: Streptococcus, Group A Screen (Direct): NEGATIVE

## 2020-09-24 MED ORDER — BENZONATATE 100 MG PO CAPS: 100.0000 mg | ORAL_CAPSULE | Freq: Three times a day (TID) | ORAL | 0 refills | Status: AC | PRN

## 2020-09-24 MED ORDER — PREDNISONE 10 MG (21) PO TBPK: ORAL_TABLET | ORAL | 0 refills | Status: DC

## 2020-09-24 NOTE — ED Provider Notes (Signed)
[place]  ____________________________________________  Time seen: Approximately 1:38 PM  I have reviewed the triage vital signs and the nursing notes.   HISTORY  Chief Complaint Cough and Sore Throat   Historian Patient     HPI Michelle Thomas is a 45 y.o. female presents to the urgent care with fever, sore throat and cough for the past 3 days.  Patient has history of recent travel and has numerous potential sick contacts.  No chest pain, chest tightness or shortness of breath.  No nausea, vomiting or diarrhea.  Patient states that she has had cough productive for purulent sputum production.  Prior to onset of symptoms, patient was well.  No alleviating measures were attempted.   Past Medical History:  Diagnosis Date   Migraine without aura, without mention of intractable migraine without mention of status migrainosus 06/18/2012   Obesity      Immunizations up to date:  Yes.     Past Medical History:  Diagnosis Date   Migraine without aura, without mention of intractable migraine without mention of status migrainosus 06/18/2012   Obesity     Patient Active Problem List   Diagnosis Date Noted   Migraine without aura 06/18/2012    Past Surgical History:  Procedure Laterality Date   LAPAROSCOPY N/A 05/03/2018   Procedure: LAPAROSCOPY DIAGNOSTIC,;  Surgeon: Arvella Nigh, MD;  Location: [place];  Service: Gynecology;  Laterality: N/A;   OVARIAN CYST REMOVAL Left 05/03/2018   Procedure: LEFT OVARIAN CYSTECTOMY;  Surgeon: Arvella Nigh, MD;  Location: [place];  Service: Gynecology;  Laterality: Left;   WISDOM TOOTH EXTRACTION      Prior to Admission medications   Medication Sig Start Date End Date Taking? Authorizing Provider  benzonatate (TESSALON PERLES) 100 MG capsule Take 1 capsule (100 mg total) by mouth 3 (three) times daily as needed for up to 7 days for cough. 09/24/20 10/01/20 Yes Vallarie Mare M, PA-C   predniSONE (STERAPRED UNI-PAK 21 TAB) 10 MG (21) TBPK tablet Take 6 tablets the first day, take 5 tablets the second day, take 4 tablets the third day, take 3 tablets the fourth day, take 2 tablets the fifth day, take 1 tablet the sixth day. 09/24/20  Yes Vallarie Mare M, PA-C  acetaminophen (TYLENOL) 500 MG tablet Take 1,000 mg by mouth every 6 (six) hours as needed.    [provider]  aspirin-acetaminophen-caffeine (EXCEDRIN MIGRAINE) [phone] MG per tablet Take 1 tablet by mouth every 6 (six) hours as needed for pain.    [provider]  diclofenac (VOLTAREN) 75 MG EC tablet Take 1 tablet (75 mg total) by mouth 2 (two) times daily. 12/20/18   Edrick Kins, DPM  ergocalciferol (VITAMIN D2) 1.25 MG (50000 UT) capsule ergocalciferol (vitamin D2) 1,250 mcg (50,000 unit) capsule    [provider]  fluconazole (DIFLUCAN) 150 MG tablet Take 150 mg by mouth daily.    [provider]  naproxen (NAPROSYN) 500 MG tablet Take 1 tablet (500 mg total) by mouth 2 (two) times daily. 01/15/20   Peri Jefferson, PA-C  rizatriptan (MAXALT) 10 MG tablet Take 1 tablet (10 mg total) by mouth 3 (three) times daily as needed for migraine. 08/12/19   Kathrynn Ducking, MD  scopolamine (TRANSDERM-SCOP) 1 MG/3DAYS Transderm-Scop 1.5 mg transdermal patch (1 mg over 3 days)    [provider]  topiramate (TOPAMAX) 25 MG tablet Take one tablet at night for one week, then take 2 tablets  at night for one week, then take 3 tablets at night. 08/12/19   Kathrynn Ducking, MD  valACYclovir (VALTREX) 500 MG tablet valacyclovir 500 mg tablet  TAKE 1 TABLET BY MOUTH TWICE DAILY    [provider]    Allergies Codeine, Penicillin v, Penicillins cross reactors, and Sumatriptan  Family History  Problem Relation Age of Onset   Hypertension Father    Diabetes Father    Cancer Father        colon cancer   Headache Sister    Diabetes Sister    Breast cancer Cousin      Social History Social History   Tobacco Use   Smoking status: Never   Smokeless tobacco: Never  Vaping Use   Vaping Use: Never used  Substance Use Topics   Alcohol use: Yes    Comment: occasionally   Drug use: No     Review of Systems  Constitutional: No fever/chills Eyes:  No discharge ENT: Patient has pharyngitis.  Respiratory: Patient has cough.  Gastrointestinal:   No nausea, no vomiting.  No diarrhea.  No constipation. Musculoskeletal: Negative for musculoskeletal pain. Skin: Negative for rash, abrasions, lacerations, ecchymosis.   ____________________________________________   PHYSICAL EXAM:  VITAL SIGNS: ED Triage Vitals  Enc Vitals Group     BP 09/24/20 1223 (!) 151/99     Pulse Rate 09/24/20 1223 84     Resp 09/24/20 1223 18     Temp 09/24/20 1223 99.1 F (37.3 C)     Temp Source 09/24/20 1223 Oral     SpO2 09/24/20 1223 100 %     Weight --      Height --      Head Circumference --      Peak Flow --      Pain Score 09/24/20 1220 8     Pain Loc --      Pain Edu? --      Excl. in [place]? --      Constitutional: Alert and oriented. Patient is lying supine. Eyes: Conjunctivae are normal. PERRL. EOMI. Head: Atraumatic. ENT:      Ears: Tympanic membranes are mildly injected with mild effusion bilaterally.       Nose: No congestion/rhinnorhea.      Mouth/Throat: Mucous membranes are moist. Posterior pharynx is mildly erythematous.  Hematological/Lymphatic/Immunilogical: No cervical lymphadenopathy.  Cardiovascular: Normal rate, regular rhythm. Normal S1 and S2.  Good peripheral circulation. Respiratory: Normal respiratory effort without tachypnea or retractions. Lungs CTAB. Good air entry to the bases with no decreased or absent breath sounds. Gastrointestinal: Bowel sounds 4 quadrants. Soft and nontender to palpation. No guarding or rigidity. No palpable masses. No distention. No CVA tenderness. Musculoskeletal: Full range of motion to all  extremities. No gross deformities appreciated. Neurologic:  Normal speech and language. No gross focal neurologic deficits are appreciated.  Skin:  Skin is warm, dry and intact. No rash noted. Psychiatric: Mood and affect are normal. Speech and behavior are normal. Patient exhibits appropriate insight and judgement.   ____________________________________________   LABS (all labs ordered are listed, but only abnormal results are displayed)  Labs Reviewed  SARS CORONAVIRUS 2 (TAT 6-24 HRS)  CULTURE, GROUP A STREP [facility])  POCT RAPID STREP A, ED / UC   ____________________________________________  EKG   ____________________________________________  RADIOLOGY   No results found.  ____________________________________________    PROCEDURES  Procedure(s) performed:     Procedures     Medications - No data to display   ____________________________________________  INITIAL IMPRESSION / ASSESSMENT AND PLAN / ED COURSE  Pertinent labs & imaging results that were available during my care of the patient were reviewed by me and considered in my medical decision making (see chart for details).      Assessment and plan Viral URI 45 year old female presents to the urgent care with pharyngitis, low-grade fever and cough for the past 3 days.  Patient was hypertensive at triage but vital signs otherwise reassuring.  COVID-19 testing is in process at this time.  Recommended starting prednisone patient has been coughing for 5 to 6 days.  Patient was also prescribed Tessalon Perles.  Return precautions were given to return with new or worsening symptoms.    ____________________________________________  FINAL CLINICAL IMPRESSION(S) / ED DIAGNOSES  Final diagnoses:  Viral upper respiratory tract infection      NEW MEDICATIONS STARTED DURING THIS VISIT:  ED Discharge Orders          Ordered    predniSONE (STERAPRED UNI-PAK 21 TAB) 10 MG (21) TBPK tablet         09/24/20 1309    benzonatate (TESSALON PERLES) 100 MG capsule  3 times daily PRN        09/24/20 1309                This chart was dictated using voice recognition software/Dragon. Despite best efforts to proofread, errors can occur which can change the meaning. Any change was purely unintentional.     Lannie Fields, PA-C 09/24/20 1341

## 2020-09-24 NOTE — Discharge Instructions (Addendum)
Take Tessalon Perles and prednisone as directed. Your COVID-19 test will post to MyChart in 2 to 3 days.

## 2020-09-24 NOTE — ED Triage Notes (Signed)
Pt reports fever on and off, sore throat and cough x 3 days. DyQuil and  NyQuil gives no relief.

## 2020-09-25 LAB — SARS CORONAVIRUS 2 (TAT 6-24 HRS): SARS Coronavirus 2: NEGATIVE

## 2020-09-27 LAB — CULTURE, GROUP A STREP (THRC)

## 2020-10-05 ENCOUNTER — Encounter (HOSPITAL_BASED_OUTPATIENT_CLINIC_OR_DEPARTMENT_OTHER): Payer: Self-pay | Admitting: Emergency Medicine

## 2020-10-05 ENCOUNTER — Emergency Department (HOSPITAL_BASED_OUTPATIENT_CLINIC_OR_DEPARTMENT_OTHER): Payer: 59

## 2020-10-05 ENCOUNTER — Other Ambulatory Visit: Payer: Self-pay

## 2020-10-05 ENCOUNTER — Emergency Department (HOSPITAL_BASED_OUTPATIENT_CLINIC_OR_DEPARTMENT_OTHER)
Admission: EM | Admit: 2020-10-05 | Discharge: 2020-10-05 | Disposition: A | Discharge status: Home / Self Care | Payer: 59 | Attending: Emergency Medicine | Admitting: Emergency Medicine

## 2020-10-05 DIAGNOSIS — H5509 Other forms of nystagmus: Secondary | ICD-10-CM | POA: Insufficient documentation

## 2020-10-05 DIAGNOSIS — R112 Nausea with vomiting, unspecified: Secondary | ICD-10-CM | POA: Insufficient documentation

## 2020-10-05 DIAGNOSIS — R059 Cough, unspecified: Secondary | ICD-10-CM | POA: Insufficient documentation

## 2020-10-05 DIAGNOSIS — R42 Dizziness and giddiness: Secondary | ICD-10-CM | POA: Diagnosis not present

## 2020-10-05 DIAGNOSIS — R0981 Nasal congestion: Secondary | ICD-10-CM | POA: Diagnosis not present

## 2020-10-05 DIAGNOSIS — Z8669 Personal history of other diseases of the nervous system and sense organs: Secondary | ICD-10-CM | POA: Insufficient documentation

## 2020-10-05 DIAGNOSIS — R519 Headache, unspecified: Secondary | ICD-10-CM

## 2020-10-05 DIAGNOSIS — R0602 Shortness of breath: Secondary | ICD-10-CM | POA: Insufficient documentation

## 2020-10-05 LAB — CBC
HCT: 40.2 % (ref 36.0–46.0)
Hemoglobin: 12.4 g/dL (ref 12.0–15.0)
MCH: 23.1 pg — ABNORMAL LOW (ref 26.0–34.0)
MCHC: 30.8 g/dL (ref 30.0–36.0)
MCV: 74.9 fL — ABNORMAL LOW (ref 80.0–100.0)
Platelets: 293 10*3/uL (ref 150–400)
RBC: 5.37 MIL/uL — ABNORMAL HIGH (ref 3.87–5.11)
RDW: 15.6 % — ABNORMAL HIGH (ref 11.5–15.5)
WBC: 12.9 10*3/uL — ABNORMAL HIGH (ref 4.0–10.5)
nRBC: 0 % (ref 0.0–0.2)

## 2020-10-05 LAB — BASIC METABOLIC PANEL
Anion gap: 8 (ref 5–15)
BUN: 15 mg/dL (ref 6–20)
CO2: 25 mmol/L (ref 22–32)
Calcium: 9.4 mg/dL (ref 8.9–10.3)
Chloride: 103 mmol/L (ref 98–111)
Creatinine, Ser: 0.91 mg/dL (ref 0.44–1.00)
GFR, Estimated: >60 mL/min (ref >60)
Glucose, Bld: 99 mg/dL (ref 70–99)
Potassium: 4.3 mmol/L (ref 3.5–5.1)
Sodium: 136 mmol/L (ref 135–145)

## 2020-10-05 MED ORDER — MECLIZINE HCL 25 MG PO TABS
12.5000 mg | ORAL_TABLET | Freq: Once | ORAL | Status: AC
  Administered 2020-10-05: 12.5 mg via ORAL
  Filled 2020-10-05: qty 1

## 2020-10-05 MED ORDER — KETOROLAC TROMETHAMINE 30 MG/ML IJ SOLN
15.0000 mg | Freq: Once | INTRAMUSCULAR | Status: AC
  Administered 2020-10-05: 15 mg via INTRAVENOUS
  Filled 2020-10-05: qty 1

## 2020-10-05 MED ORDER — ONDANSETRON HCL 4 MG/2ML IJ SOLN
4.0000 mg | Freq: Once | INTRAMUSCULAR | Status: AC
  Administered 2020-10-05: 4 mg via INTRAVENOUS

## 2020-10-05 MED ORDER — MECLIZINE HCL 25 MG PO TABS: 25.0000 mg | ORAL_TABLET | Freq: Three times a day (TID) | ORAL | 0 refills | Status: DC | PRN

## 2020-10-05 MED ORDER — ONDANSETRON HCL 4 MG/2ML IJ SOLN
INTRAMUSCULAR | Status: AC
  Filled 2020-10-05: qty 2

## 2020-10-05 MED ORDER — SODIUM CHLORIDE 0.9 % IV BOLUS
1000.0000 mL | Freq: Once | INTRAVENOUS | Status: AC
  Administered 2020-10-05: 1000 mL via INTRAVENOUS

## 2020-10-05 MED ORDER — PROCHLORPERAZINE EDISYLATE 10 MG/2ML IJ SOLN
10.0000 mg | Freq: Once | INTRAMUSCULAR | Status: AC
  Administered 2020-10-05: 10 mg via INTRAVENOUS
  Filled 2020-10-05: qty 2

## 2020-10-05 NOTE — ED Triage Notes (Signed)
  Patient comes in with headache and dizziness that has been going on for about a week.  Patient states she was sick about a week ago and had a fever.  Patient was seen at PCP and tested for COVID.  Patient was negative for COVID and strep on 6/27.  Patient started having N/V today and as concerned.  Patient feels like her respiratory symptoms have improved but still dizzy.  Hx of migraines took excedrine around 1200. Pain 7/10, dull headache.

## 2020-10-05 NOTE — ED Notes (Signed)
States felling better

## 2020-10-06 NOTE — ED Provider Notes (Signed)
[facility] EMERGENC[facility] DEPT Provider Note   CSN: [id number] Arrival date & time: 10/05/20  1927     History Chief Complaint  Patient presents with   Nausea   Headache   Dizziness    Michelle Thomas is a 45 y.o. female.   Headache Associated symptoms: congestion and dizziness   Associated symptoms: no abdominal pain   Dizziness Associated symptoms: headaches and shortness of breath   Patient presents feeling bad.  Headache and dizziness.  Going for around a week.  States that when she moves and sometimes will close her eyes she will feel as if things are moving around.  Headache is dull and is.  Not typical for her migraines.  Had been seen by PCP and had negative COVID and strep testing.  Some nausea and vomiting and feels still little bad.  No numbness or weakness.  Little cough but no sputum production.  Did have previous history of complicated migraines.    Past Medical History:  Diagnosis Date   Migraine without aura, without mention of intractable migraine without mention of status migrainosus 06/18/2012   Obesity     Patient Active Problem List   Diagnosis Date Noted   Migraine without aura 06/18/2012    Past Surgical History:  Procedure Laterality Date   LAPAROSCOPY N/A 05/03/2018   Procedure: LAPAROSCOPY DIAGNOSTIC,;  Surgeon: Arvella Nigh, MD;  Location: [facility];  Service: Gynecology;  Laterality: N/A;   OVARIAN CYST REMOVAL Left 05/03/2018   Procedure: LEFT OVARIAN CYSTECTOMY;  Surgeon: Arvella Nigh, MD;  Location: [facility];  Service: Gynecology;  Laterality: Left;   WISDOM TOOTH EXTRACTION       OB History   No obstetric history on file.     Family History  Problem Relation Age of Onset   Hypertension Father    Diabetes Father    Cancer Father        colon cancer   Headache Sister    Diabetes Sister    Breast cancer Cousin     Social History   Tobacco Use   Smoking status: Never    Smokeless tobacco: Never  Vaping Use   Vaping Use: Never used  Substance Use Topics   Alcohol use: Yes    Comment: occasionally   Drug use: No    Home Medications Prior to Admission medications   Medication Sig Start Date End Date Taking? Authorizing Provider  meclizine (ANTIVERT) 25 MG tablet Take 1 tablet (25 mg total) by mouth 3 (three) times daily as needed for dizziness. 10/05/20  Yes Davonna Belling, MD  acetaminophen (TYLENOL) 500 MG tablet Take 1,000 mg by mouth every 6 (six) hours as needed.    [provider]  aspirin-acetaminophen-caffeine (EXCEDRIN MIGRAINE) [phone] MG per tablet Take 1 tablet by mouth every 6 (six) hours as needed for pain.    [provider]  diclofenac (VOLTAREN) 75 MG EC tablet Take 1 tablet (75 mg total) by mouth 2 (two) times daily. 12/20/18   Edrick Kins, DPM  ergocalciferol (VITAMIN D2) 1.25 MG (50000 UT) capsule ergocalciferol (vitamin D2) 1,250 mcg (50,000 unit) capsule    [provider]  fluconazole (DIFLUCAN) 150 MG tablet Take 150 mg by mouth daily.    [provider]  naproxen (NAPROSYN) 500 MG tablet Take 1 tablet (500 mg total) by mouth 2 (two) times daily. 01/15/20   Peri Jefferson, PA-C  predniSONE (STERAPRED UNI-PAK 21 TAB) 10 MG (21) TBPK tablet Take 6  tablets the first day, take 5 tablets the second day, take 4 tablets the third day, take 3 tablets the fourth day, take 2 tablets the fifth day, take 1 tablet the sixth day. 09/24/20   Lannie Fields, PA-C  rizatriptan (MAXALT) 10 MG tablet Take 1 tablet (10 mg total) by mouth 3 (three) times daily as needed for migraine. 08/12/19   Kathrynn Ducking, MD  scopolamine (TRANSDERM-SCOP) 1 MG/3DAYS Transderm-Scop 1.5 mg transdermal patch (1 mg over 3 days)    [provider]  topiramate (TOPAMAX) 25 MG tablet Take one tablet at night for one week, then take 2 tablets at night for one week, then take 3 tablets at night. 08/12/19   Kathrynn Ducking, MD   valACYclovir (VALTREX) 500 MG tablet valacyclovir 500 mg tablet  TAKE 1 TABLET BY MOUTH TWICE DAILY    [provider]    Allergies    Codeine, Penicillin v, Penicillins cross reactors, and Sumatriptan  Review of Systems   Review of Systems  Constitutional:  Negative for chills.  HENT:  Positive for congestion.   Respiratory:  Positive for shortness of breath.   Cardiovascular:  Negative for leg swelling.  Gastrointestinal:  Negative for abdominal pain.  Genitourinary:  Negative for flank pain.  Skin:  Negative for rash.  Neurological:  Positive for dizziness and headaches. Negative for syncope.   Physical Exam Updated Vital Signs BP 127/70   Pulse 74   Temp 99.4 F (37.4 C) (Oral)   Resp 18   Ht 5\' 10"  (1.778 m)   Wt (!) 137 kg   LMP 09/11/2020 (Approximate)   SpO2 98%   BMI 43.33 kg/m   Physical Exam Vitals and nursing note reviewed.  Constitutional:      Appearance: She is well-developed.  Eyes:     Extraocular Movements: Extraocular movements intact.     Pupils: Pupils are equal, round, and reactive to light.     Comments: Some nystagmus with gaze to the left.  Cardiovascular:     Rate and Rhythm: Regular rhythm.  Pulmonary:     Breath sounds: Normal breath sounds. No wheezing or rhonchi.  Musculoskeletal:     Cervical back: Neck supple.  Skin:    General: Skin is warm and dry.  Neurological:     Mental Status: She is alert.     Comments: Face symmetric.  Eye movements intact with some mild nystagmus.  Finger-nose intact bilaterally.  Awake and appropriate.  Psychiatric:        Mood and Affect: Mood normal.    ED Results / Procedures / Treatments   Labs (all labs ordered are listed, but only abnormal results are displayed) Labs Reviewed  CBC - Abnormal; Notable for the following components:      Result Value   WBC 12.9 (*)    RBC 5.37 (*)    MCV 74.9 (*)    MCH 23.1 (*)    RDW 15.6 (*)    All other components within normal limits   BASIC METABOLIC PANEL    EKG None  Radiology DG Chest Portable 1 View  Result Date: 10/05/2020 CLINICAL DATA:  45 year old female with cough. EXAM: PORTABLE CHEST 1 VIEW COMPARISON:  Chest radiograph dated 06/08/2012 FINDINGS: No focal consolidation, pleural effusion, pneumothorax. Or pneumothorax. No acute osseous pathology. IMPRESSION: No active disease. Electronically Signed   By: Anner Crete M.D.   On: 10/05/2020 21:11    Procedures Procedures   Medications Ordered in ED Medications  ondansetron [facility]) injection 4 mg (4 mg Intravenous Given 10/05/20 2010)  prochlorperazine (COMPAZINE) injection 10 mg (10 mg Intravenous Given 10/05/20 2034)  meclizine (ANTIVERT) tablet 12.5 mg (12.5 mg Oral Given 10/05/20 2036)  sodium chloride 0.9 % bolus 1,000 mL (0 mLs Intravenous Stopped 10/05/20 2158)  ketorolac (TORADOL) 30 MG/ML injection 15 mg (15 mg Intravenous Given 10/05/20 2032)    ED Course  I have reviewed the triage vital signs and the nursing notes.  Pertinent labs & imaging results that were available during my care of the patient were reviewed by me and considered in my medical decision making (see chart for details).    MDM Rules/Calculators/A&P                          Patient feeling bad and dizziness.  Has had some URI symptoms.  Negative COVID test as an outpatient.  History of complicated migraines.  Had work-up with MRI during an episode.  Today with this episode treated with migraine cocktail and Antivert and patient feels much better.  Symptoms resolved.  I think likely either complicated migraine or peripheral vertigo.  Had had some congestion which could predispose to peripheral vertigo.  Either way I do not think she needs further work-up at this time for a central cause of vertigo.  Feeling better and discharged home. Final Clinical Impression(s) / ED Diagnoses Final diagnoses:  Acute nonintractable headache, unspecified headache type  Vertigo    Rx / DC  Orders ED Discharge Orders          Ordered    meclizine (ANTIVERT) 25 MG tablet  3 times daily PRN        10/05/20 2209             Davonna Belling, MD 10/06/20 2356

## 2021-04-11 ENCOUNTER — Ambulatory Visit
Admission: EM | Admit: 2021-04-11 | Discharge: 2021-04-11 | Disposition: A | Discharge status: Home / Self Care | Payer: 59

## 2021-04-11 ENCOUNTER — Encounter: Payer: Self-pay | Admitting: Emergency Medicine

## 2021-04-11 DIAGNOSIS — G43101 Migraine with aura, not intractable, with status migrainosus: Secondary | ICD-10-CM

## 2021-04-11 MED ORDER — RIZATRIPTAN BENZOATE 10 MG PO TABS: ORAL_TABLET | ORAL | 0 refills | Status: DC

## 2021-04-11 MED ORDER — ONDANSETRON 8 MG PO TBDP
8.0000 mg | ORAL_TABLET | Freq: Once | ORAL | Status: AC
  Administered 2021-04-11: 8 mg via ORAL

## 2021-04-11 MED ORDER — KETOROLAC TROMETHAMINE 60 MG/2ML IM SOLN
60.0000 mg | Freq: Once | INTRAMUSCULAR | Status: AC
  Administered 2021-04-11: 60 mg via INTRAMUSCULAR

## 2021-04-11 NOTE — ED Triage Notes (Signed)
Pt presents with Migraine x 4 days with some visual changes in her right eye.

## 2021-04-11 NOTE — Discharge Instructions (Addendum)
When you get home take Benadryl 50 mg  Follow up with your primary care doctor to check if he/she wants you back on Topamax.

## 2021-04-11 NOTE — ED Provider Notes (Signed)
[facility]    CSN: [id number] Arrival date & time: 04/11/21  1026      History   Chief Complaint Chief Complaint  Patient presents with   Migraine    HPI Michelle Thomas is a 46 y.o. female with onset of Migraine 4 days ago. Has ran out of her Maxalt and Topamax a while a ago. Yesterday noticed getting nausea, but has not vomited. She also started having vision changes in R eye which sometimes happens with her migraines and that means it will not resolve on its own. Pain level 8/10      Past Medical History:  Diagnosis Date   Migraine without aura, without mention of intractable migraine without mention of status migrainosus 06/18/2012   Obesity     Patient Active Problem List   Diagnosis Date Noted   Migraine without aura 06/18/2012    Past Surgical History:  Procedure Laterality Date   LAPAROSCOPY N/A 05/03/2018   Procedure: LAPAROSCOPY DIAGNOSTIC,;  Surgeon: Arvella Nigh, MD;  Location: [facility];  Service: Gynecology;  Laterality: N/A;   OVARIAN CYST REMOVAL Left 05/03/2018   Procedure: LEFT OVARIAN CYSTECTOMY;  Surgeon: Arvella Nigh, MD;  Location: [facility];  Service: Gynecology;  Laterality: Left;   WISDOM TOOTH EXTRACTION      OB History   No obstetric history on file.      Home Medications    Prior to Admission medications   Medication Sig Start Date End Date Taking? Authorizing Provider  aspirin-acetaminophen-caffeine (EXCEDRIN MIGRAINE) [phone] MG per tablet Take 1 tablet by mouth every 6 (six) hours as needed for pain.   Yes [provider]  Cholecalciferol (VITAMIN D3) 1.25 MG (50000 UT) CAPS Take 1 capsule by mouth once a week. 02/05/21   [provider]  rizatriptan (MAXALT) 10 MG tablet Take on at onset of HA, then repeat 2 hours later 04/11/21   Rodriguez-Southworth, Sunday Spillers, PA-C  scopolamine (TRANSDERM-SCOP) 1 MG/3DAYS Transderm-Scop 1.5 mg transdermal patch (1 mg over 3 days)     [provider]  topiramate (TOPAMAX) 25 MG tablet Take one tablet at night for one week, then take 2 tablets at night for one week, then take 3 tablets at night. 08/12/19   Kathrynn Ducking, MD  traZODone (DESYREL) 50 MG tablet Take 50 mg by mouth at bedtime as needed. 02/05/21   [provider]    Family History Family History  Problem Relation Age of Onset   Hypertension Father    Diabetes Father    Cancer Father        colon cancer   Headache Sister    Diabetes Sister    Breast cancer Cousin     Social History Social History   Tobacco Use   Smoking status: Never   Smokeless tobacco: Never  Vaping Use   Vaping Use: Never used  Substance Use Topics   Alcohol use: Yes    Comment: occasionally   Drug use: No     Allergies   Codeine, Penicillin v, Penicillins cross reactors, and Sumatriptan   Review of Systems Review of Systems  HENT:  Negative for congestion, sinus pressure and sinus pain.   Eyes:  Positive for visual disturbance.  Respiratory:  Negative for cough.   Gastrointestinal:  Positive for nausea. Negative for vomiting.  Neurological:  Positive for headaches. Negative for dizziness, weakness and numbness.    Physical Exam Triage Vital Signs ED Triage Vitals  Enc Vitals Group  BP 04/11/21 1039 (!) 142/89     Pulse Rate 04/11/21 1039 76     Resp 04/11/21 1039 18     Temp 04/11/21 1039 98.9 F (37.2 C)     Temp Source 04/11/21 1039 Oral     SpO2 04/11/21 1039 96 %     Weight --      Height --      Head Circumference --      Peak Flow --      Pain Score 04/11/21 1043 8     Pain Loc --      Pain Edu? --      Excl. in [place]? --    No data found.  Updated Vital Signs BP (!) 142/89 (BP Location: Left Arm)    Pulse 76    Temp 98.9 F (37.2 C) (Oral)    Resp 18    LMP 03/31/2021    SpO2 96%   Visual Acuity Right Eye Distance:   Left Eye Distance:   Bilateral Distance:    Right Eye Near:   Left Eye Near:    Bilateral  Near:     Physical Exam Physical Exam Vitals signs and nursing note reviewed.  Constitutional:      General: He is not in acute distress.    Appearance: He is well-developed and normal weight. He is not ill-appearing, toxic-appearing or diaphoretic.  HENT:     Head: Normocephalic.  Eyes:     Extraocular Movements: Extraocular movements intact.     Pupils: Pupils are equal, round, and reactive to light.  Neck:     Musculoskeletal: Neck supple. No neck rigidity.     Meningeal: Brudzinski's sign absent.  Cardiovascular:     Rate and Rhythm: Normal rate and regular rhythm.     Heart sounds: No murmur.  Pulmonary:     Effort: Pulmonary effort is normal.     Breath sounds: Normal breath sounds. No wheezing, rhonchi or rales.    Musculoskeletal: Normal range of motion.  Lymphadenopathy:     Cervical: No cervical adenopathy.  Skin:    General: Skin is warm and dry.  Neurological:     Mental Status: He is alert.     Cranial Nerves: No cranial nerve deficit or facial asymmetry.     Sensory: No sensory deficit.     Motor: No weakness.     Coordination: Romberg sign negative. Coordination normal.     Gait: Gait normal.     Deep Tendon Reflexes: Reflexes normal.     Comments: Normal Romberg, finger to nose, tandem gait.   Psychiatric:        Mood and Affect: Mood normal.        Speech: Speech normal.        Behavior: Behavior normal.    UC Treatments / Results  Labs (all labs ordered are listed, but only abnormal results are displayed) Labs Reviewed - No data to display  EKG   Radiology No results found.  Procedures Procedures (including critical care time)  Medications Ordered in UC Medications  ketorolac (TORADOL) injection 60 mg (60 mg Intramuscular Given 04/11/21 1126)  ondansetron (ZOFRAN-ODT) disintegrating tablet 8 mg (8 mg Oral Given 04/11/21 1126)    Initial Impression / Assessment and Plan / UC Course  I have reviewed the triage vital signs and the nursing  notes. She has acute migraine. She was given Toradol 60 mg IM, Zofran ODT 8 mg. Advised to go home and take Benadryl 50 mg  PO since this is part of the cocktail that helps her about her migraines, but she does not have a driver to give her as a shot.  Pain level at discharge 6/10     Final Clinical Impressions(s) / UC Diagnoses   Final diagnoses:  Migraine with aura and with status migrainosus, not intractable     Discharge Instructions      When you get home take Benadryl 50 mg  Follow up with your primary care doctor to check if he/she wants you back on Topamax.      ED Prescriptions     Medication Sig Dispense Auth. Provider   rizatriptan (MAXALT) 10 MG tablet Take on at onset of HA, then repeat 2 hours later 10 tablet Rodriguez-Southworth, Sunday Spillers, PA-C      PDMP not reviewed this encounter.   Shelby Mattocks, PA-C 04/11/21 1152

## 2021-05-24 IMAGING — US US THYROID
1 series · 13 of 25 positions shown · non-contrast
Comparison: Prior thyroid ultrasound 01/09/2017

CLINICAL DATA: Goiter.

EXAM:
THYROID ULTRASOUND
TECHNIQUE: Ultrasound examination of the thyroid gland and adjacent soft
tissues was performed.

[Series 1: us thyroid · 0.06mm/px · 13 of 59 slices shown]
[im 1/59]
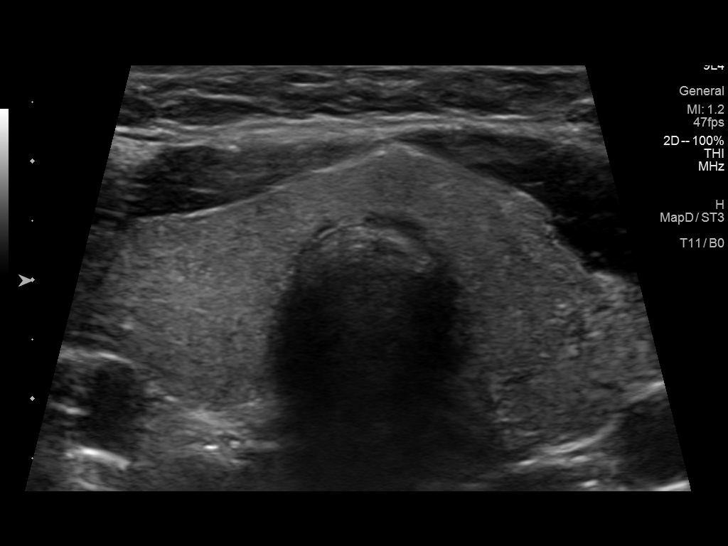
[im 5/59]
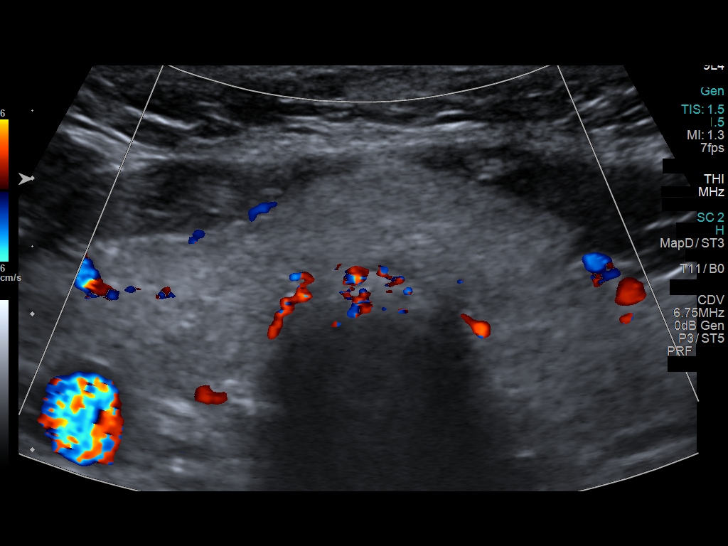
[im 10/59]
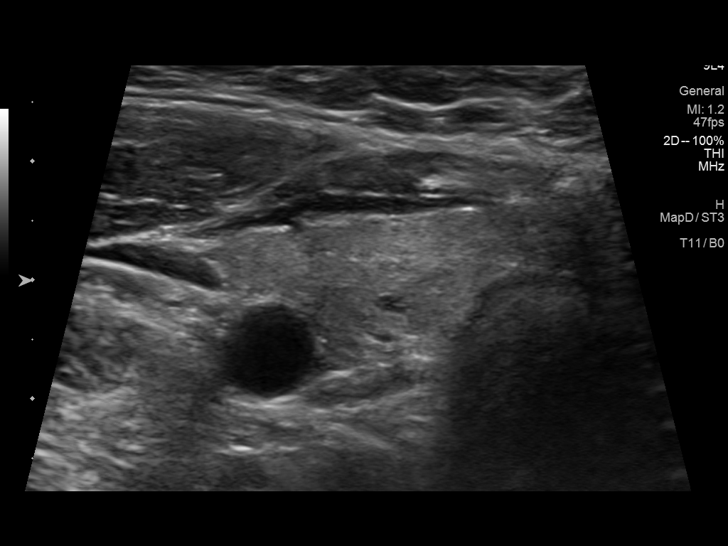
[im 15/59]
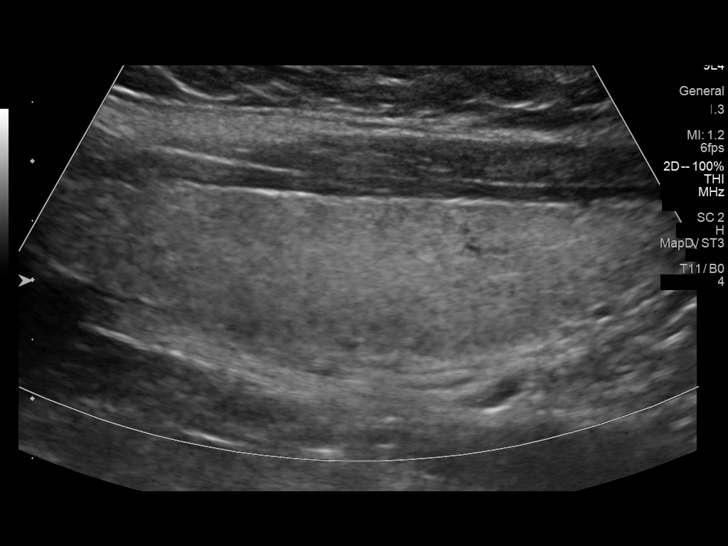
[im 20/59]
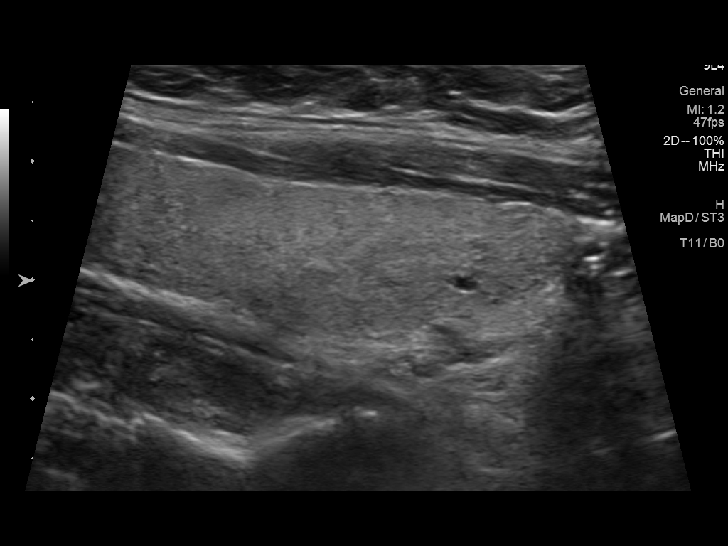
[im 25/59]
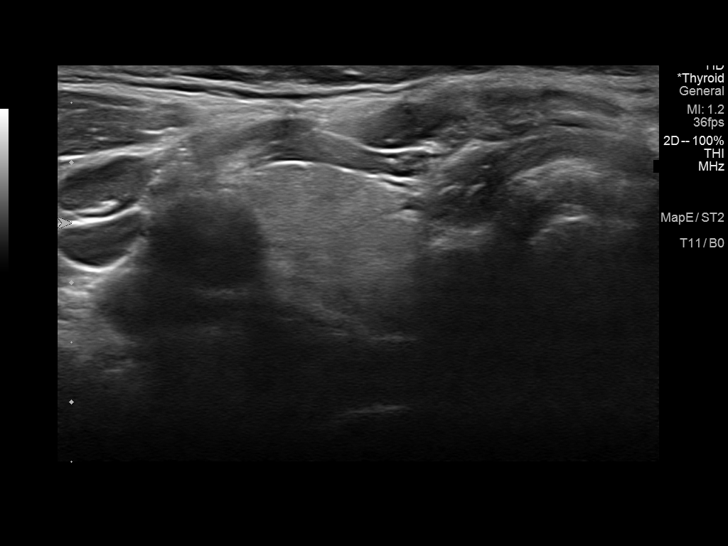
[im 30/59]
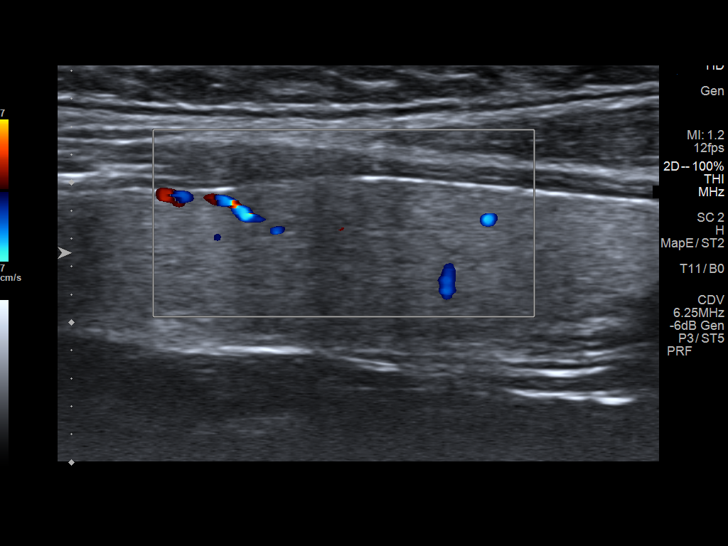
[im 34/59]
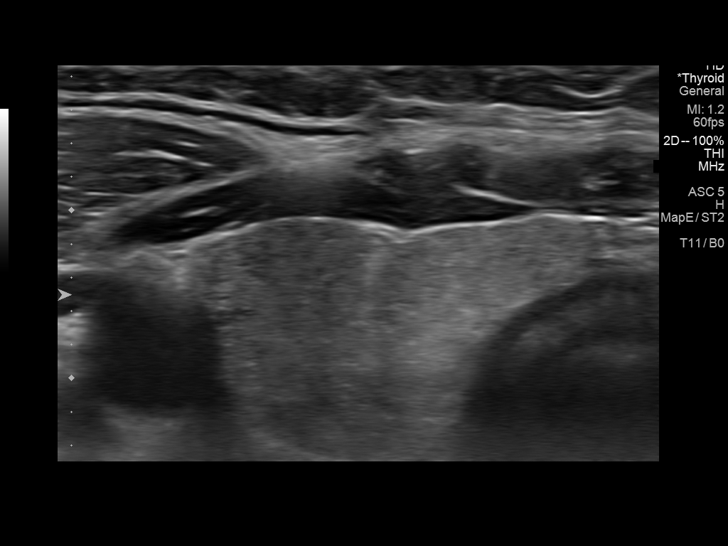
[im 39/59]
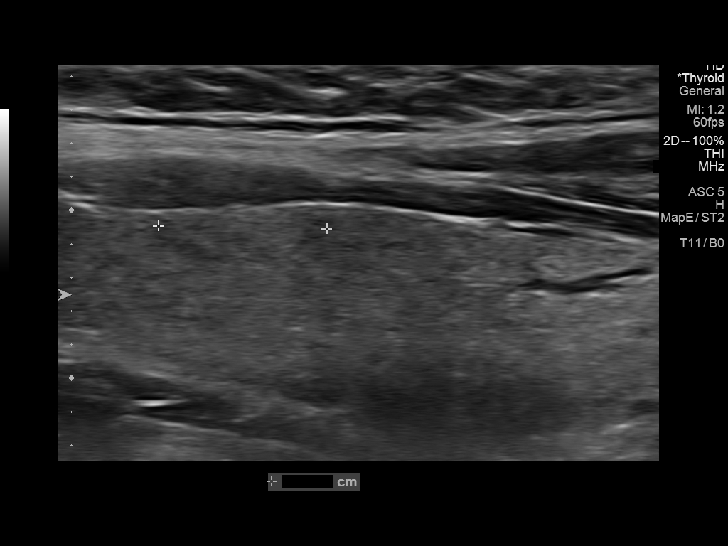
[im 44/59]
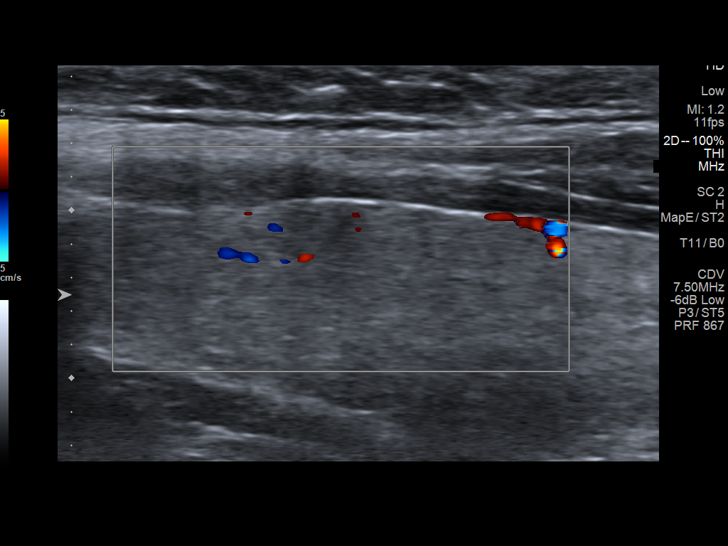
[im 49/59]
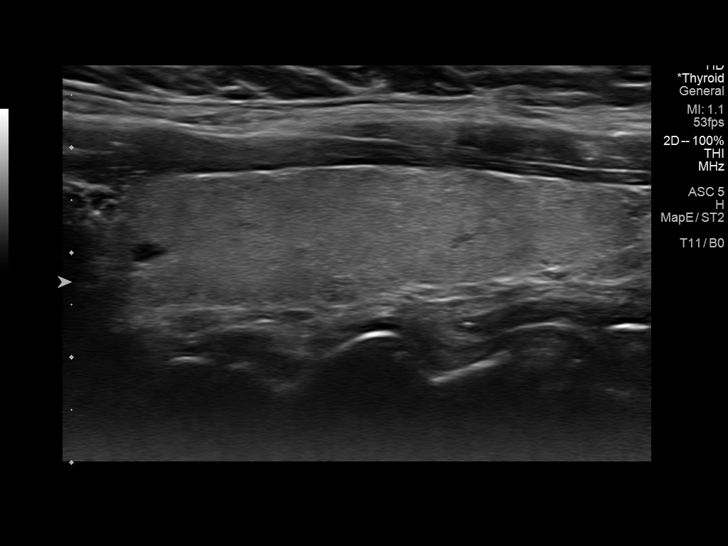
[im 54/59]
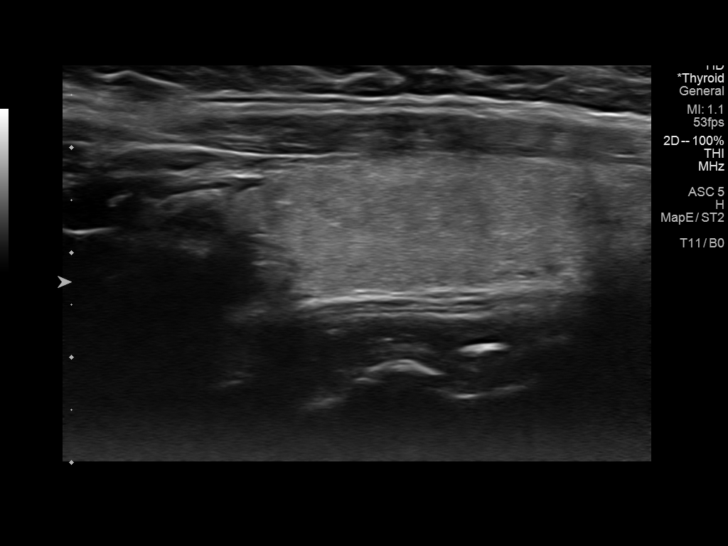
[im 59/59]
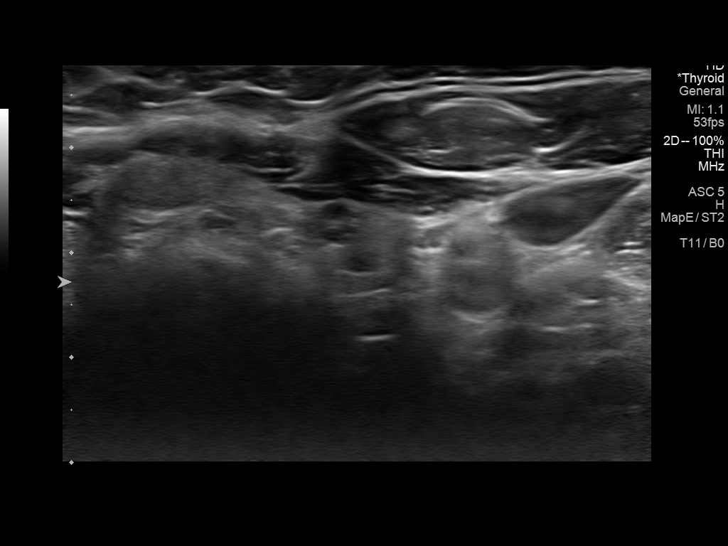

[13 of 25 positions shown; findings below may reference images not displayed]

FINDINGS: Parenchymal Echotexture: Mildly heterogenous

Isthmus: 0.8 cm

Right lobe: 5.6 x 1.4 x 2.6 cm

Left lobe: 5.4 x 1.4 x 2.3 cm

_________________________________________________________

Estimated total number of nodules >/= 1 cm: 1

Number of spongiform nodules >/=  2 cm not described below (TR1): 0

Number of mixed cystic and solid nodules >/= 1.5 cm not described
below (TR2): 0

_________________________________________________________

Nodule # 1:

Prior biopsy: No

Location: Right; Superior

Maximum size: 1.1 cm; Other 2 dimensions: 0.4 x 0.7 cm, previously,
1.3 x 0.9 x 1.0 cm

Composition: solid/almost completely solid (2)

Echogenicity: isoechoic (1)

Shape: not taller-than-wide (0)

Margins: ill-defined (0)

Echogenic foci: none (0)

ACR TI-RADS total points: 3.

ACR TI-RADS risk category:  TR3 (3 points).

Significant change in size (>/= 20% in two dimensions and minimal
increase of 2 mm): Yes

Change in features: Yes

Change in ACR TI-RADS risk category: Yes

ACR TI-RADS recommendations:

Given size (<1.4 cm) and appearance, this nodule does NOT meet
TI-RADS criteria for biopsy or dedicated follow-up.

_________________________________________________________
IMPRESSION: Decreasing size and conspicuity of the previously identified thyroid
nodule in the right superior gland. The nodule no longer meets
criteria to warrant further surveillance. No further follow-up
recommended.

The above is in keeping with the ACR TI-RADS recommendations - [HOSPITAL] 9719;[DATE].

## 2022-01-01 ENCOUNTER — Encounter (INDEPENDENT_AMBULATORY_CARE_PROVIDER_SITE_OTHER): Payer: 59 | Admitting: Family Medicine

## 2022-01-11 ENCOUNTER — Emergency Department (HOSPITAL_BASED_OUTPATIENT_CLINIC_OR_DEPARTMENT_OTHER)
Admission: EM | Admit: 2022-01-11 | Discharge: 2022-01-11 | Disposition: A | Discharge status: Home / Self Care | Payer: 59 | Attending: Emergency Medicine | Admitting: Emergency Medicine

## 2022-01-11 ENCOUNTER — Other Ambulatory Visit: Payer: Self-pay

## 2022-01-11 ENCOUNTER — Encounter (HOSPITAL_BASED_OUTPATIENT_CLINIC_OR_DEPARTMENT_OTHER): Payer: Self-pay | Admitting: *Deleted

## 2022-01-11 DIAGNOSIS — S61211A Laceration without foreign body of left index finger without damage to nail, initial encounter: Secondary | ICD-10-CM | POA: Diagnosis not present

## 2022-01-11 DIAGNOSIS — W260XXA Contact with knife, initial encounter: Secondary | ICD-10-CM | POA: Insufficient documentation

## 2022-01-11 DIAGNOSIS — S60941A Unspecified superficial injury of left index finger, initial encounter: Secondary | ICD-10-CM | POA: Diagnosis present

## 2022-01-11 NOTE — ED Provider Notes (Signed)
[facility] Provider Note   CSN: [id number] Arrival date & time: 01/11/22  2148     History  Chief Complaint  Patient presents with   Laceration    Michelle Thomas is a 46 y.o. female.  Pt cut her finger while cutting cilantro  The history is provided by the patient. No language interpreter was used.  Laceration Location:  Finger Finger laceration location:  L index finger Length:  0.2 Depth:  Cutaneous Quality: straight   Laceration mechanism:  Knife Pain details:    Severity:  Moderate   Timing:  Constant Relieved by:  Nothing Worsened by:  Nothing Tetanus status:  Up to date      Home Medications Prior to Admission medications   Medication Sig Start Date End Date Taking? Authorizing Provider  aspirin-acetaminophen-caffeine (EXCEDRIN MIGRAINE) [phone] MG per tablet Take 1 tablet by mouth every 6 (six) hours as needed for pain.    [provider]  Cholecalciferol (VITAMIN D3) 1.25 MG (50000 UT) CAPS Take 1 capsule by mouth once a week. 02/05/21   [provider]  rizatriptan (MAXALT) 10 MG tablet Take on at onset of HA, then repeat 2 hours later 04/11/21   Rodriguez-Southworth, Sunday Spillers, PA-C  scopolamine (TRANSDERM-SCOP) 1 MG/3DAYS Transderm-Scop 1.5 mg transdermal patch (1 mg over 3 days)    [provider]  topiramate (TOPAMAX) 25 MG tablet Take one tablet at night for one week, then take 2 tablets at night for one week, then take 3 tablets at night. 08/12/19   Kathrynn Ducking, MD  traZODone (DESYREL) 50 MG tablet Take 50 mg by mouth at bedtime as needed. 02/05/21   [provider]      Allergies    Codeine, Penicillin v, Penicillins cross reactors, and Sumatriptan    Review of Systems   Review of Systems  Skin:  Positive for wound.  All other systems reviewed and are negative.   Physical Exam Updated Vital Signs BP (!) 147/85 (BP Location: Right Arm)   Pulse 96   Temp 98.5 F  (36.9 C) (Oral)   Resp 16   LMP 12/19/2021 (Approximate)   SpO2 96%  Physical Exam Vitals reviewed.  Constitutional:      Appearance: Normal appearance.  Skin:    Comments: 89m laceration side of finger,  from nv and ns intact  Neurological:     General: No focal deficit present.     Mental Status: She is alert.  Psychiatric:        Mood and Affect: Mood normal.     ED Results / Procedures / Treatments   Labs (all labs ordered are listed, but only abnormal results are displayed) Labs Reviewed - No data to display  EKG None  Radiology No results found.  Procedures ..Marland Kitchenaceration Repair  Date/Time: 01/11/2022 10:57 PM  Performed by: SFransico Meadow PA-C Authorized by: SFransico Meadow PA-C   Consent:    Consent obtained:  Verbal   Consent given by:  Patient   Risks discussed:  Infection Universal protocol:    Procedure explained and questions answered to patient or proxy's satisfaction: yes     Immediately prior to procedure, a time out was called: yes   Laceration details:    Location:  Finger   Finger location:  L index finger   Length (cm):  0.2 Pre-procedure details:    Preparation:  Patient was prepped and draped in usual sterile fashion Exploration:    Limited defect created (  wound extended): no   Treatment:    Area cleansed with:  Chlorhexidine   Debridement:  None Skin repair:    Repair method:  Tissue adhesive Repair type:    Repair type:  Simple     Medications Ordered in ED Medications - No data to display  ED Course/ Medical Decision Making/ A&P                           Medical Decision Making Pt cut her finger with a knife chopping cilantro  Risk Risk Details: Dermabond to wound see note           Final Clinical Impression(s) / ED Diagnoses Final diagnoses:  Laceration of left index finger, foreign body presence unspecified, nail damage status unspecified, initial encounter    Rx / DC Orders ED Discharge Orders      None      An After Visit Summary was printed and given to the patient.    Sidney Ace 01/11/22 2259    Isla Pence, MD 01/11/22 2314

## 2022-01-11 NOTE — ED Triage Notes (Signed)
Pt has laceration to left middle finger which occurred while chopping cilantro around 3:30 pm.  Bleeding controlled at this time.  Last tetanus in the last 5 years.

## 2022-03-03 IMAGING — US US BREAST*L* LIMITED INC AXILLA
1 series · 7 of 7 positions shown · non-contrast
Comparison: Previous exam(s).

ACR Breast Density Category a: The breast tissue is almost entirely
fatty.

CLINICAL DATA: 44-year-old female presenting with a new lump in the
left breast. Patient states this was previously painful but that has
improved.

EXAM:
DIGITAL DIAGNOSTIC UNILATERAL LEFT MAMMOGRAM WITH TOMOSYNTHESIS AND
CAD; ULTRASOUND LEFT BREAST LIMITED
TECHNIQUE: Left digital diagnostic mammography and breast tomosynthesis was
performed. The images were evaluated with computer-aided detection.;
Targeted ultrasound examination of the left breast was performed

[Series 1: us breast*left* limited inc axilla · 0.06mm/px · 7 of 7 slices shown]
[im 1/7]
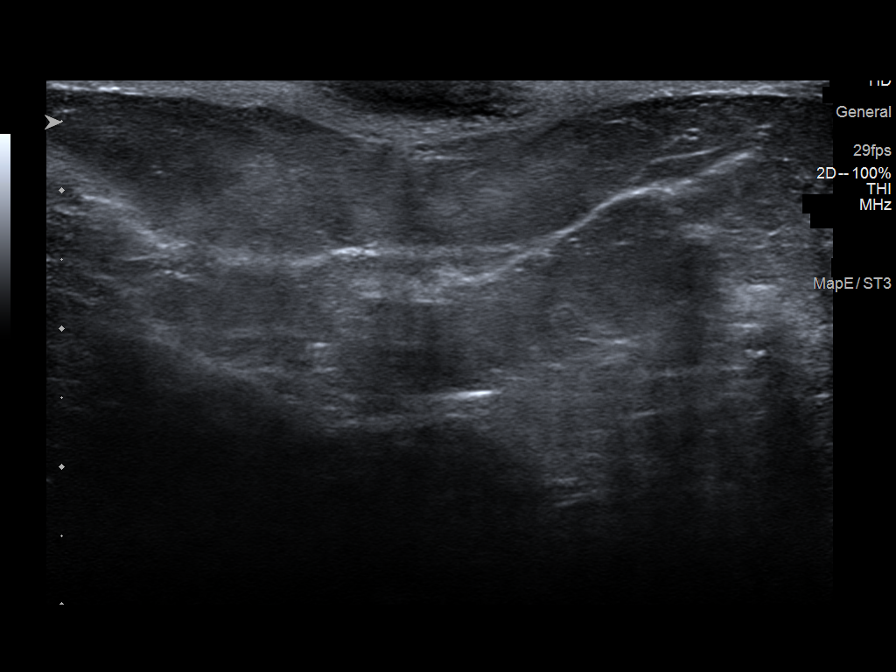
[im 2/7]
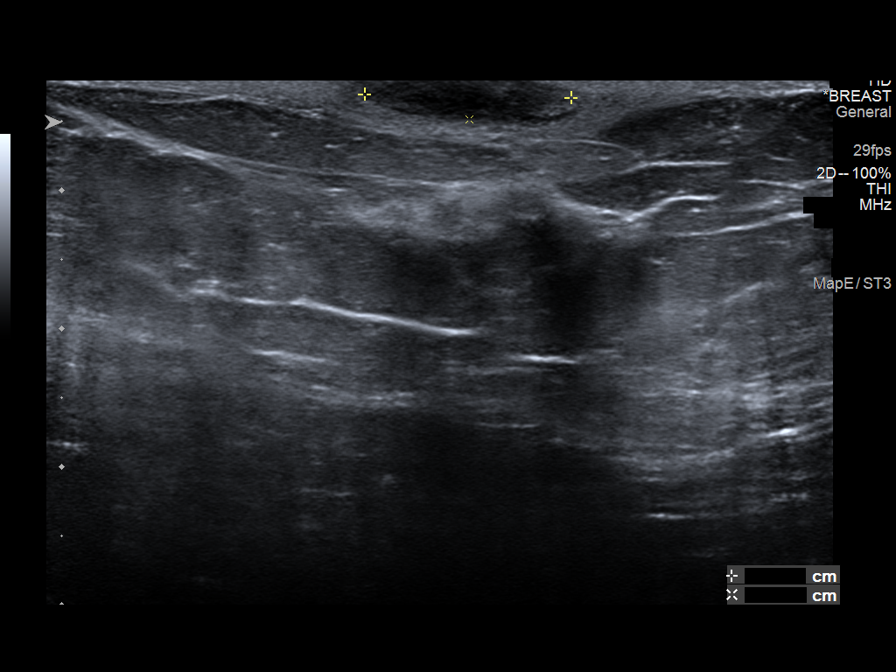
[im 3/7]
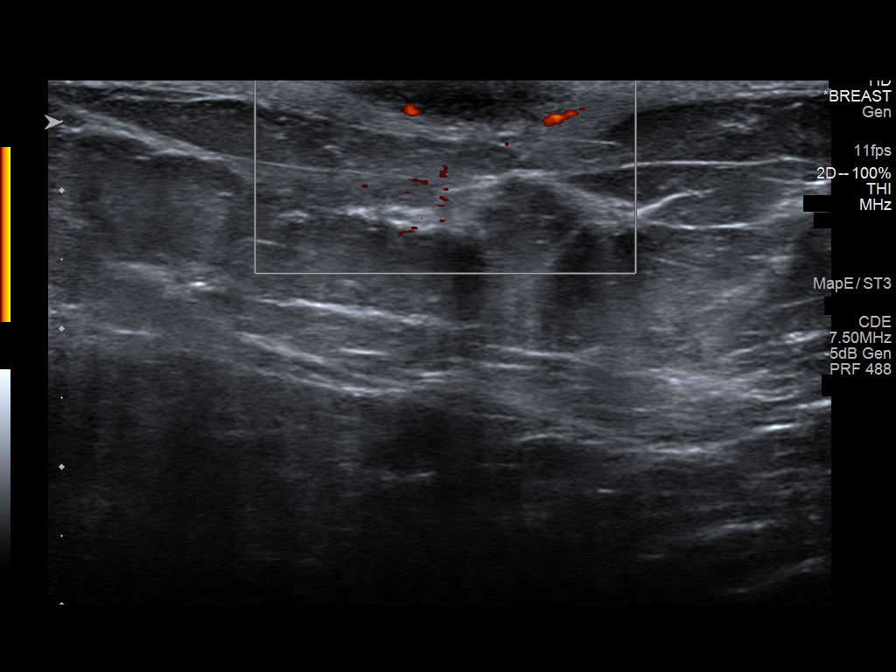
[im 4/7]
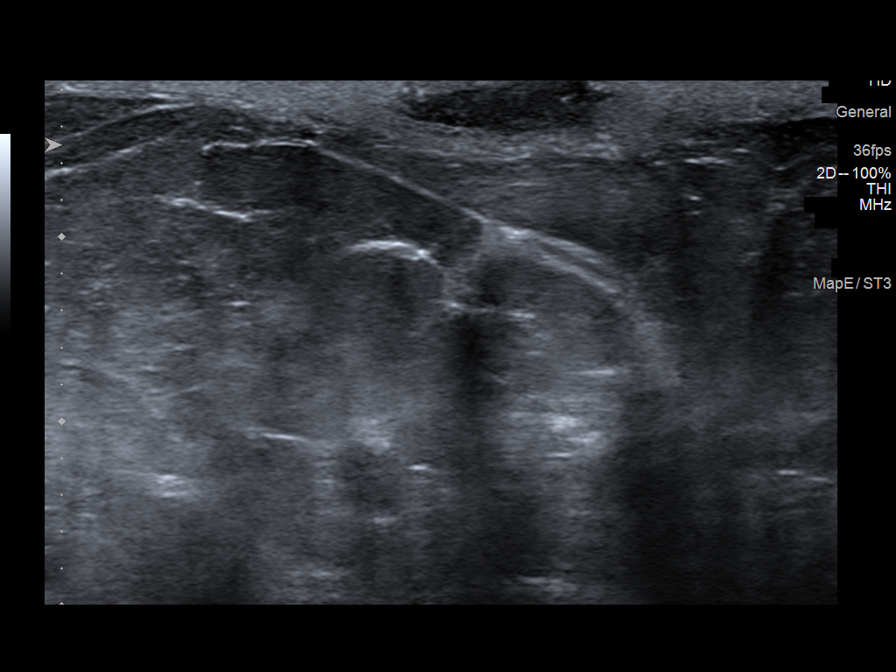
[im 5/7]
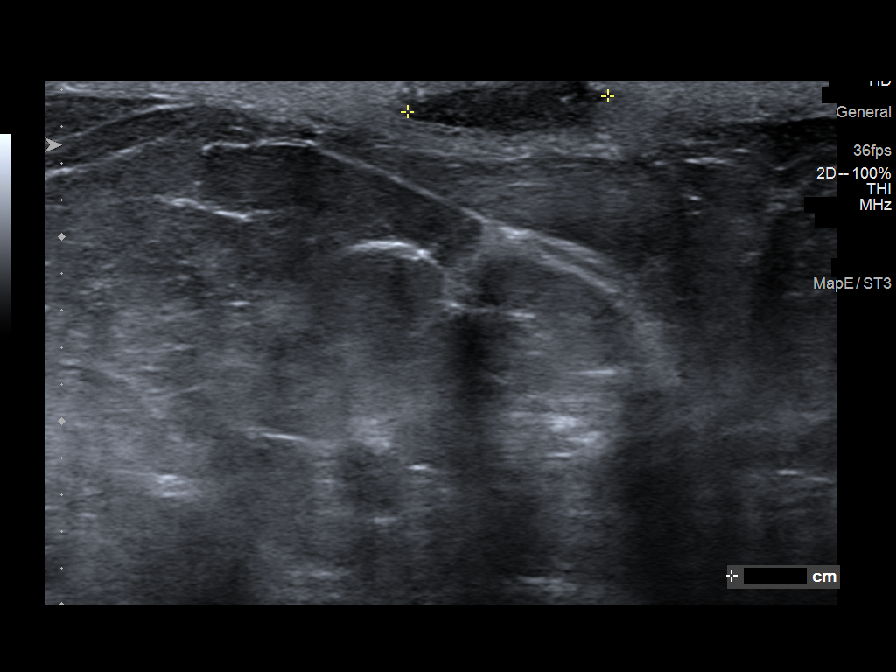
[im 6/7]
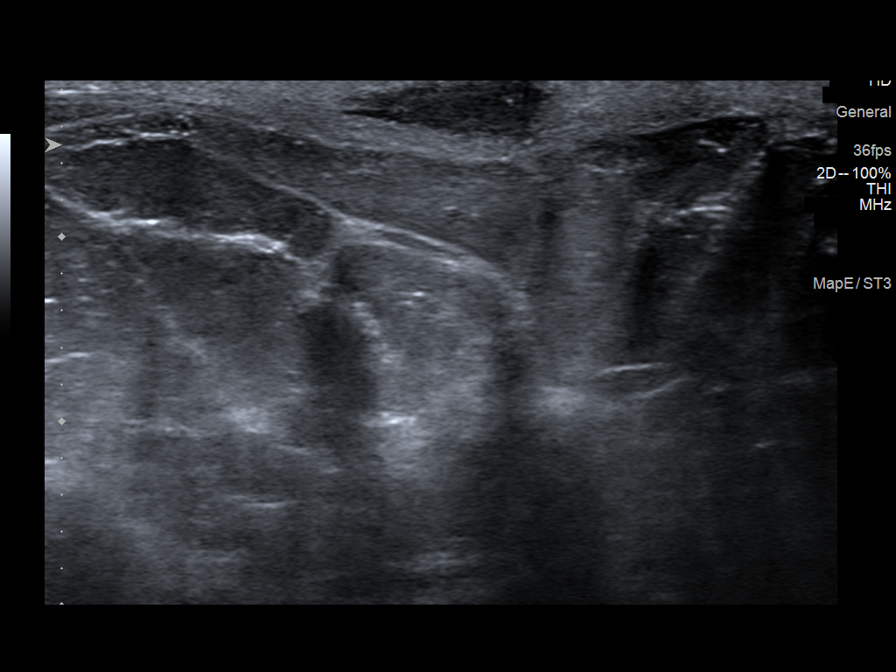
[im 7/7]
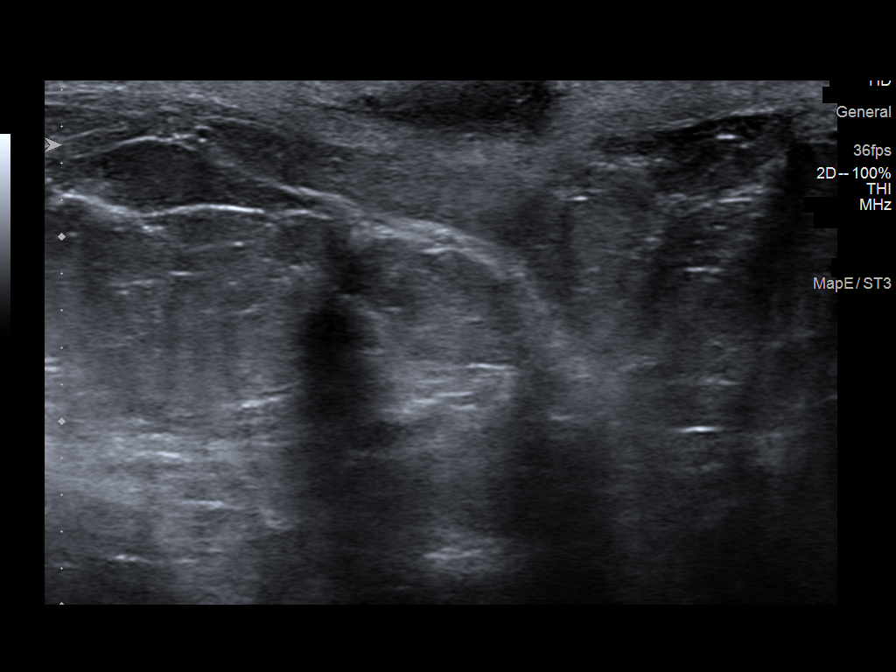

[7 of 7 positions shown; findings below may reference images not displayed]

FINDINGS: Mammogram: A skin BB marks the site of concern reported by the
patient in the central inferior left breast. A spot tangential view
of this area is performed in addition to standard views. There is no
definite new abnormality at the palpable site or elsewhere in the
left breast.

On physical exam I feel a small focal superficial thickening. There
is no erythema on the skin.

Ultrasound: Targeted ultrasound performed at the site of concern in
the left breast at 6 o'clock 9 cm from the nipple demonstrating an
oval circumscribed hypoechoic mass measuring 1.5 x 0.3 x 1.1 cm with
a tract leading to the skin. No internal vascularity. No abnormality
in the deeper breast tissue.
IMPRESSION: At the palpable site of concern in the left breast there is a benign
skin lesion measuring 1.5 cm which may represent an inflamed
sebaceous cyst. The patient states the tenderness is improving. No
clinical signs of infection.

RECOMMENDATION:
1.  Clinical follow-up as needed for the left breast skin lesion.

2. Return for routine annual screening mammography which will be due
in August 2020.

I have discussed the findings and recommendations with the patient.
If applicable, a reminder letter will be sent to the patient
regarding the next appointment.

BI-RADS CATEGORY  2: Benign.

## 2022-03-19 ENCOUNTER — Ambulatory Visit
Admission: EM | Admit: 2022-03-19 | Discharge: 2022-03-19 | Disposition: A | Discharge status: Home / Self Care | Payer: 59 | Attending: Emergency Medicine | Admitting: Emergency Medicine

## 2022-03-19 DIAGNOSIS — R110 Nausea: Secondary | ICD-10-CM | POA: Diagnosis not present

## 2022-03-19 DIAGNOSIS — G43909 Migraine, unspecified, not intractable, without status migrainosus: Secondary | ICD-10-CM

## 2022-03-19 MED ORDER — ONDANSETRON 4 MG PO TBDP
4.0000 mg | ORAL_TABLET | Freq: Once | ORAL | Status: AC
  Administered 2022-03-19: 4 mg via ORAL

## 2022-03-19 MED ORDER — ONDANSETRON 4 MG PO TBDP: 4.0000 mg | ORAL_TABLET | Freq: Three times a day (TID) | ORAL | 0 refills | Status: DC | PRN

## 2022-03-19 MED ORDER — KETOROLAC TROMETHAMINE 30 MG/ML IJ SOLN
30.0000 mg | Freq: Once | INTRAMUSCULAR | Status: AC
  Administered 2022-03-19: 30 mg via INTRAMUSCULAR

## 2022-03-19 NOTE — Discharge Instructions (Addendum)
You were given Zofran and an injection of Toradol for your migraine headache.    Follow up with your primary care provider if your symptoms are not improving.    Go to the emergency department if you have worsening symptoms.

## 2022-03-19 NOTE — ED Provider Notes (Signed)
[facility]    CSN: [id number] Arrival date & time: 03/19/22  1706      History   Chief Complaint Chief Complaint  Patient presents with   Headache    HPI Michelle Thomas is a 46 y.o. female.  Patient presents with 2 day history of migraine headache with nausea and eyes watering.  She states her current headache is like her typical migraine.  No trauma.  No weakness, numbness, vomiting, fever, or other symptoms.  Treatment attempted with Excedrin.  Her medical history includes migraine headaches and obesity.  She denies current pregnancy or breastfeeding.    The history is provided by the patient and medical records.    Past Medical History:  Diagnosis Date   Migraine without aura, without mention of intractable migraine without mention of status migrainosus 06/18/2012   Obesity     Patient Active Problem List   Diagnosis Date Noted   Migraine without aura 06/18/2012    Past Surgical History:  Procedure Laterality Date   LAPAROSCOPY N/A 05/03/2018   Procedure: LAPAROSCOPY DIAGNOSTIC,;  Surgeon: Arvella Nigh, MD;  Location: [facility];  Service: Gynecology;  Laterality: N/A;   OVARIAN CYST REMOVAL Left 05/03/2018   Procedure: LEFT OVARIAN CYSTECTOMY;  Surgeon: Arvella Nigh, MD;  Location: [facility];  Service: Gynecology;  Laterality: Left;   WISDOM TOOTH EXTRACTION      OB History   No obstetric history on file.      Home Medications    Prior to Admission medications   Medication Sig Start Date End Date Taking? Authorizing Provider  ondansetron (ZOFRAN-ODT) 4 MG disintegrating tablet Take 1 tablet (4 mg total) by mouth every 8 (eight) hours as needed for nausea or vomiting. 03/19/22  Yes Sharion Balloon, NP  aspirin-acetaminophen-caffeine (EXCEDRIN MIGRAINE) [phone] MG per tablet Take 1 tablet by mouth every 6 (six) hours as needed for pain.    [provider]  Cholecalciferol (VITAMIN D3) 1.25 MG (50000  UT) CAPS Take 1 capsule by mouth once a week. 02/05/21   [provider]  rizatriptan (MAXALT) 10 MG tablet Take on at onset of HA, then repeat 2 hours later 04/11/21   Rodriguez-Southworth, Sunday Spillers, PA-C  scopolamine (TRANSDERM-SCOP) 1 MG/3DAYS Transderm-Scop 1.5 mg transdermal patch (1 mg over 3 days)    [provider]  topiramate (TOPAMAX) 25 MG tablet Take one tablet at night for one week, then take 2 tablets at night for one week, then take 3 tablets at night. 08/12/19   Kathrynn Ducking, MD  traZODone (DESYREL) 50 MG tablet Take 50 mg by mouth at bedtime as needed. 02/05/21   [provider]    Family History Family History  Problem Relation Age of Onset   Hypertension Father    Diabetes Father    Cancer Father        colon cancer   Headache Sister    Diabetes Sister    Breast cancer Cousin     Social History Social History   Tobacco Use   Smoking status: Never   Smokeless tobacco: Never  Vaping Use   Vaping Use: Never used  Substance Use Topics   Alcohol use: Yes    Comment: occasionally   Drug use: No     Allergies   Codeine, Penicillin v, Penicillins cross reactors, and Sumatriptan   Review of Systems Review of Systems  Constitutional:  Negative for chills and fever.  HENT:  Negative for ear pain and  sore throat.   Eyes:  Negative for pain and visual disturbance.  Respiratory:  Negative for cough and shortness of breath.   Cardiovascular:  Negative for chest pain and palpitations.  Gastrointestinal:  Positive for nausea. Negative for abdominal pain and vomiting.  Skin:  Negative for color change and rash.  Neurological:  Positive for headaches. Negative for dizziness, syncope, facial asymmetry, speech difficulty, weakness, light-headedness and numbness.  All other systems reviewed and are negative.    Physical Exam Triage Vital Signs ED Triage Vitals  Enc Vitals Group     BP 03/19/22 1905 (!) 149/89     Pulse Rate 03/19/22 1856  80     Resp 03/19/22 1856 18     Temp 03/19/22 1856 99 F (37.2 C)     Temp src --      SpO2 03/19/22 1856 99 %     Weight --      Height 03/19/22 1901 '5\' 10"'$  (1.778 m)     Head Circumference --      Peak Flow --      Pain Score 03/19/22 1900 7     Pain Loc --      Pain Edu? --      Excl. in [place]? --    No data found.  Updated Vital Signs BP (!) 149/89   Pulse 80   Temp 99 F (37.2 C)   Resp 18   Ht '5\' 10"'$  (1.778 m)   LMP 02/24/2022   SpO2 99%   BMI 43.33 kg/m   Visual Acuity Right Eye Distance:   Left Eye Distance:   Bilateral Distance:    Right Eye Near:   Left Eye Near:    Bilateral Near:     Physical Exam Vitals and nursing note reviewed.  Constitutional:      General: She is not in acute distress.    Appearance: She is well-developed. She is not ill-appearing.  HENT:     Right Ear: Tympanic membrane normal.     Left Ear: Tympanic membrane normal.     Nose: Nose normal.     Mouth/Throat:     Mouth: Mucous membranes are moist.     Pharynx: Oropharynx is clear.  Eyes:     Pupils: Pupils are equal, round, and reactive to light.  Cardiovascular:     Rate and Rhythm: Normal rate and regular rhythm.     Heart sounds: Normal heart sounds.  Pulmonary:     Effort: Pulmonary effort is normal. No respiratory distress.     Breath sounds: Normal breath sounds.  Musculoskeletal:     Cervical back: Neck supple.  Skin:    General: Skin is warm and dry.  Neurological:     General: No focal deficit present.     Mental Status: She is alert and oriented to person, place, and time.     Sensory: No sensory deficit.     Motor: No weakness.     Gait: Gait normal.  Psychiatric:        Mood and Affect: Mood normal.        Behavior: Behavior normal.      UC Treatments / Results  Labs (all labs ordered are listed, but only abnormal results are displayed) Labs Reviewed - No data to display  EKG   Radiology No results found.  Procedures Procedures (including  critical care time)  Medications Ordered in UC Medications  ondansetron (ZOFRAN-ODT) disintegrating tablet 4 mg (has no administration in time range)  ketorolac (TORADOL) 30 MG/ML injection 30 mg (has no administration in time range)    Initial Impression / Assessment and Plan / UC Course  I have reviewed the triage vital signs and the nursing notes.  Pertinent labs & imaging results that were available during my care of the patient were reviewed by me and considered in my medical decision making (see chart for details).    Migraine headache, Nausea.  Treated here with Zofran and Toradol.  ED precautions discussed.  Education provided on migraine headache.  Instructed patient to follow up with her PCP if her symptoms are not improving.  She agrees to plan of care.    Final Clinical Impressions(s) / UC Diagnoses   Final diagnoses:  Migraine without status migrainosus, not intractable, unspecified migraine type  Nausea without vomiting     Discharge Instructions      You were given Zofran and an injection of Toradol for your migraine headache.    Follow up with your primary care provider if your symptoms are not improving.    Go to the emergency department if you have worsening symptoms.       ED Prescriptions     Medication Sig Dispense Auth. Provider   ondansetron (ZOFRAN-ODT) 4 MG disintegrating tablet Take 1 tablet (4 mg total) by mouth every 8 (eight) hours as needed for nausea or vomiting. 20 tablet Sharion Balloon, NP      PDMP not reviewed this encounter.   Sharion Balloon, NP 03/19/22 1932

## 2022-03-19 NOTE — ED Triage Notes (Addendum)
Patient to Urgent Care with complaints of migraine x2 days. Reports she is usually able to control her triggers but this time it came out of nowhere. Has been taking Excedrin around the clock with minimal relief.  Reports nausea, no emesis. Eyes are watering.   Hx of migraines.

## 2022-04-25 IMAGING — DX DG CHEST 1V PORT
1 series · 1 of 1 positions shown · non-contrast
Comparison: Chest radiograph dated 06/08/2012

CLINICAL DATA: 45-year-old female with cough.

EXAM:
PORTABLE CHEST 1 VIEW

[chest]
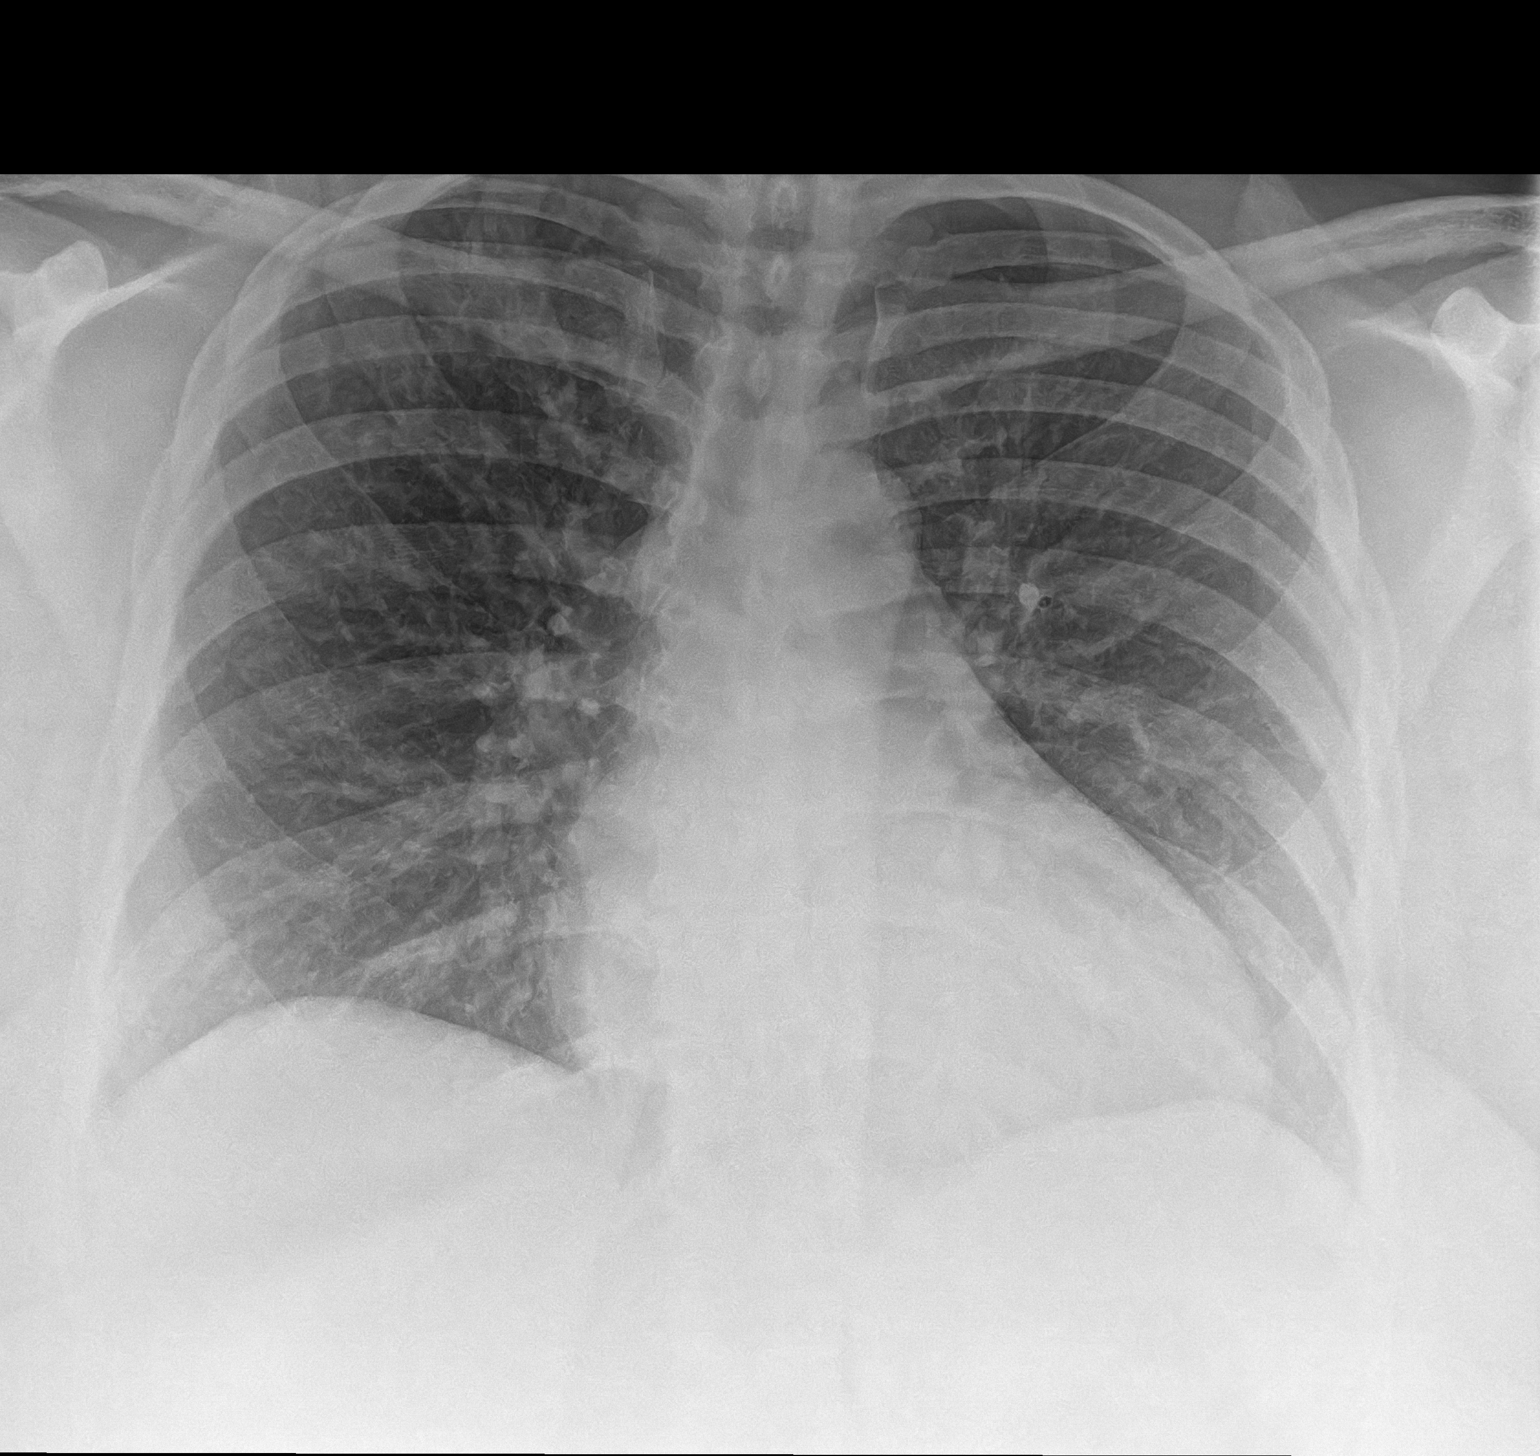

[1 of 1 positions shown; findings below may reference images not displayed]

FINDINGS: No focal consolidation, pleural effusion, pneumothorax. Or
pneumothorax. No acute osseous pathology.
IMPRESSION: No active disease.

## 2022-08-16 ENCOUNTER — Ambulatory Visit
Admission: EM | Admit: 2022-08-16 | Discharge: 2022-08-16 | Disposition: A | Discharge status: Home / Self Care | Payer: 59

## 2022-08-16 DIAGNOSIS — L259 Unspecified contact dermatitis, unspecified cause: Secondary | ICD-10-CM | POA: Diagnosis not present

## 2022-08-16 HISTORY — DX: Essential (primary) hypertension: I10

## 2022-08-16 MED ORDER — DEXAMETHASONE SODIUM PHOSPHATE 10 MG/ML IJ SOLN
10.0000 mg | Freq: Once | INTRAMUSCULAR | Status: AC
  Administered 2022-08-16: 10 mg via INTRAMUSCULAR

## 2022-08-16 MED ORDER — PREDNISONE 10 MG (21) PO TBPK: ORAL_TABLET | Freq: Every day | ORAL | 0 refills | Status: DC

## 2022-08-16 NOTE — Discharge Instructions (Addendum)
You were given an injection of a steroid called dexamthasone.  Start the prednisone taper tomorrow as directed.    Take Benadryl or Zyrtec as directed.    Follow up with your primary care provider.   Go to the emergency department if you have difficulty swallowing or breathing.

## 2022-08-16 NOTE — ED Provider Notes (Signed)
[facility] Thomas    CSN: [id number] Arrival date & time: 08/16/22  1258      History   Chief Complaint Chief Complaint  Patient presents with   Rash    HPI Michelle Thomas is a 47 y.o. female.  Patient presents with pruritic rash on chest, back, and face x 1 week.  The rash started after she changed detergent.  No other new products.  No new medications or foods.  No recent travel.  Treating with Benadryl and hydrocortisone cream without relief.  She denies fever, sore throat, difficulty swallowing, cough, shortness of breath, or other symptoms.  Her medical history includes hypertension, migraine headache, obesity.  The history is provided by the patient and medical records.    Past Medical History:  Diagnosis Date   Hypertension    Migraine without aura, without mention of intractable migraine without mention of status migrainosus 06/18/2012   Obesity     Patient Active Problem List   Diagnosis Date Noted   Migraine without aura 06/18/2012    Past Surgical History:  Procedure Laterality Date   LAPAROSCOPY N/A 05/03/2018   Procedure: LAPAROSCOPY DIAGNOSTIC,;  Surgeon: Richardean Chimera, MD;  Location: [facility];  Service: Gynecology;  Laterality: N/A;   OVARIAN CYST REMOVAL Left 05/03/2018   Procedure: LEFT OVARIAN CYSTECTOMY;  Surgeon: Richardean Chimera, MD;  Location: [facility];  Service: Gynecology;  Laterality: Left;   PLANTAR FASCIA RELEASE Left    WISDOM TOOTH EXTRACTION      OB History   No obstetric history on file.      Home Medications    Prior to Admission medications   Medication Sig Start Date End Date Taking? Authorizing Provider  amLODipine (NORVASC) 5 MG tablet Take 10 mg by mouth daily. 06/02/22  Yes [provider]  losartan (COZAAR) 50 MG tablet Take 50 mg by mouth daily. 08/10/22  Yes [provider]  predniSONE (STERAPRED UNI-PAK 21 TAB) 10 MG (21) TBPK tablet Take by mouth daily. As  directed 08/17/22  Yes Mickie Bail, NP  aspirin-acetaminophen-caffeine (EXCEDRIN MIGRAINE) [phone] MG per tablet Take 1 tablet by mouth every 6 (six) hours as needed for pain.    [provider]  Cholecalciferol (VITAMIN D3) 1.25 MG (50000 UT) CAPS Take 1 capsule by mouth once a week. 02/05/21   [provider]  ondansetron (ZOFRAN-ODT) 4 MG disintegrating tablet Take 1 tablet (4 mg total) by mouth every 8 (eight) hours as needed for nausea or vomiting. 03/19/22   Mickie Bail, NP  rizatriptan (MAXALT) 10 MG tablet Take on at onset of HA, then repeat 2 hours later 04/11/21   Rodriguez-Southworth, Nettie Elm, PA-C  scopolamine (TRANSDERM-SCOP) 1 MG/3DAYS Transderm-Scop 1.5 mg transdermal patch (1 mg over 3 days)    [provider]  topiramate (TOPAMAX) 25 MG tablet Take one tablet at night for one week, then take 2 tablets at night for one week, then take 3 tablets at night. 08/12/19   York Spaniel, MD  traZODone (DESYREL) 50 MG tablet Take 50 mg by mouth at bedtime as needed. 02/05/21   [provider]    Family History Family History  Problem Relation Age of Onset   Hypertension Father    Diabetes Father    Cancer Father        colon cancer   Headache Sister    Diabetes Sister    Breast cancer Cousin     Social History Social History  Tobacco Use   Smoking status: Never   Smokeless tobacco: Never  Vaping Use   Vaping Use: Never used  Substance Use Topics   Alcohol use: Yes    Comment: occasionally   Drug use: No     Allergies   Codeine, Penicillin v, Penicillins cross reactors, and Sumatriptan   Review of Systems Review of Systems  Constitutional:  Negative for chills and fever.  HENT:  Negative for ear pain, sore throat, trouble swallowing and voice change.   Respiratory:  Negative for cough and shortness of breath.   Cardiovascular:  Negative for chest pain and palpitations.  Skin:  Positive for rash. Negative for color change.      Physical Exam Triage Vital Signs ED Triage Vitals [08/16/22 1301]  Enc Vitals Group     BP      Pulse      Resp      Temp      Temp src      SpO2      Weight      Height      Head Circumference      Peak Flow      Pain Score 0     Pain Loc      Pain Edu?      Excl. in [place]?    No data found.  Updated Vital Signs BP 130/84   Pulse 78   Temp 98.9 F (37.2 C)   Resp 18   LMP 08/12/2022   SpO2 98%   Visual Acuity Right Eye Distance:   Left Eye Distance:   Bilateral Distance:    Right Eye Near:   Left Eye Near:    Bilateral Near:     Physical Exam Vitals and nursing note reviewed.  Constitutional:      General: She is not in acute distress.    Appearance: She is well-developed. She is not ill-appearing.  HENT:     Mouth/Throat:     Mouth: Mucous membranes are moist.     Pharynx: Oropharynx is clear.  Cardiovascular:     Rate and Rhythm: Normal rate and regular rhythm.     Heart sounds: Normal heart sounds.  Pulmonary:     Effort: Pulmonary effort is normal. No respiratory distress.     Breath sounds: Normal breath sounds. No wheezing, rhonchi or rales.  Musculoskeletal:     Cervical back: Neck supple.  Skin:    General: Skin is warm and dry.     Findings: Rash present.     Comments: Diffuse flesh-colored maculopapular rash on face and trunk.  Neurological:     Mental Status: She is alert.  Psychiatric:        Mood and Affect: Mood normal.        Behavior: Behavior normal.      UC Treatments / Results  Labs (all labs ordered are listed, but only abnormal results are displayed) Labs Reviewed - No data to display  EKG   Radiology No results found.  Procedures Procedures (including critical care time)  Medications Ordered in UC Medications  dexamethasone (DECADRON) injection 10 mg (10 mg Intramuscular Given 08/16/22 1331)    Initial Impression / Assessment and Plan / UC Course  I have reviewed the triage vital signs and the nursing  notes.  Pertinent labs & imaging results that were available during my care of the patient were reviewed by me and considered in my medical decision making (see chart for details).  Contact dermatitis.  Afebrile, VSS.  Dexamethasone given here, starting prednisone taper tomorrow.  Discussed use of antihistamine such as Benadryl or Zyrtec.  Education provided on contact dermatitis.  Instructed patient to follow up with her PCP if her symptoms are not improving.  She agrees to plan of care.    Final Clinical Impressions(s) / UC Diagnoses   Final diagnoses:  Contact dermatitis, unspecified contact dermatitis type, unspecified trigger     Discharge Instructions      You were given an injection of a steroid called dexamthasone.  Start the prednisone taper tomorrow as directed.    Take Benadryl or Zyrtec as directed.    Follow up with your primary care provider.   Go to the emergency department if you have difficulty swallowing or breathing.       ED Prescriptions     Medication Sig Dispense Auth. Provider   predniSONE (STERAPRED UNI-PAK 21 TAB) 10 MG (21) TBPK tablet Take by mouth daily. As directed 21 tablet Mickie Bail, NP      PDMP not reviewed this encounter.   Mickie Bail, NP 08/16/22 [phone]

## 2022-08-16 NOTE — ED Triage Notes (Addendum)
Patient to Urgent Care with complaints of itchy rash present to chest and middle back.  Reports symptoms started one week ago. Reports recently changing detergents. Rash has not improved with benadryl/ cortisol-10 cream/ benadryl cream usage.

## 2022-12-10 ENCOUNTER — Ambulatory Visit: Payer: 59 | Admitting: Primary Care

## 2023-03-03 ENCOUNTER — Ambulatory Visit: Payer: 59 | Admitting: Primary Care

## 2023-03-03 VITALS — BP 124/86 | HR 82 | Temp 97.5°F | Ht 70.0 in | Wt 314.0 lb

## 2023-03-03 DIAGNOSIS — I10 Essential (primary) hypertension: Secondary | ICD-10-CM | POA: Diagnosis not present

## 2023-03-03 DIAGNOSIS — G43009 Migraine without aura, not intractable, without status migrainosus: Secondary | ICD-10-CM | POA: Diagnosis not present

## 2023-03-03 DIAGNOSIS — R7303 Prediabetes: Secondary | ICD-10-CM | POA: Diagnosis not present

## 2023-03-03 DIAGNOSIS — R631 Polydipsia: Secondary | ICD-10-CM | POA: Diagnosis not present

## 2023-03-03 DIAGNOSIS — E66813 Obesity, class 3: Secondary | ICD-10-CM | POA: Insufficient documentation

## 2023-03-03 DIAGNOSIS — G479 Sleep disorder, unspecified: Secondary | ICD-10-CM | POA: Diagnosis not present

## 2023-03-03 DIAGNOSIS — Z6841 Body Mass Index (BMI) 40.0 and over, adult: Secondary | ICD-10-CM

## 2023-03-03 LAB — CBC
HCT: 38.3 % (ref 36.0–46.0)
Hemoglobin: 11.9 g/dL — ABNORMAL LOW (ref 12.0–15.0)
MCHC: 31 g/dL (ref 30.0–36.0)
MCV: 75.2 fL — ABNORMAL LOW (ref 78.0–100.0)
Platelets: 244 10*3/uL (ref 150.0–400.0)
RBC: 5.1 Mil/uL (ref 3.87–5.11)
RDW: 16.1 % — ABNORMAL HIGH (ref 11.5–15.5)
WBC: 8 10*3/uL (ref 4.0–10.5)

## 2023-03-03 LAB — COMPREHENSIVE METABOLIC PANEL
ALT: 17 U/L (ref 0–35)
AST: 14 U/L (ref 0–37)
Albumin: 3.9 g/dL (ref 3.5–5.2)
Alkaline Phosphatase: 87 U/L (ref 39–117)
BUN: 12 mg/dL (ref 6–23)
CO2: 30 meq/L (ref 19–32)
Calcium: 9.4 mg/dL (ref 8.4–10.5)
Chloride: 104 meq/L (ref 96–112)
Creatinine, Ser: 0.86 mg/dL (ref 0.40–1.20)
GFR: 80.97 mL/min (ref >60.00)
Glucose, Bld: 76 mg/dL (ref 70–99)
Potassium: 4.6 meq/L (ref 3.5–5.1)
Sodium: 139 meq/L (ref 135–145)
Total Bilirubin: 0.4 mg/dL (ref 0.2–1.2)
Total Protein: 7.6 g/dL (ref 6.0–8.3)

## 2023-03-03 LAB — POCT GLYCOSYLATED HEMOGLOBIN (HGB A1C): Hemoglobin A1C: 5.7 % — AB (ref 4.0–5.6)

## 2023-03-03 LAB — LIPID PANEL
Cholesterol: 248 mg/dL — ABNORMAL HIGH (ref 0–200)
HDL: 86.2 mg/dL (ref >39.00)
LDL Cholesterol: 143 mg/dL — ABNORMAL HIGH (ref 0–99)
NonHDL: 161.76
Total CHOL/HDL Ratio: 3
Triglycerides: 93 mg/dL (ref 0.0–149.0)
VLDL: 18.6 mg/dL (ref 0.0–40.0)

## 2023-03-03 LAB — TSH: TSH: 0.69 u[IU]/mL (ref 0.35–5.50)

## 2023-03-03 MED ORDER — AMLODIPINE BESYLATE 5 MG PO TABS: 5.0000 mg | ORAL_TABLET | Freq: Every day | ORAL | 3 refills | Status: DC

## 2023-03-03 MED ORDER — RIZATRIPTAN BENZOATE 5 MG PO TABS: ORAL_TABLET | ORAL | 0 refills | Status: AC

## 2023-03-03 MED ORDER — WEGOVY 0.25 MG/0.5ML ~~LOC~~ SOAJ: 0.2500 mg | SUBCUTANEOUS | 0 refills | Status: DC

## 2023-03-03 NOTE — Assessment & Plan Note (Signed)
Symptoms suspicious for sleep apnea.  Referral placed to [facility] for sleep study. Continue trazodone 50 mg as needed.

## 2023-03-03 NOTE — Assessment & Plan Note (Signed)
Poor diet, lack of consistent physical activity. Long discussion today regarding her obesity and changes that could be made.  Limit fast food/takeout food and juice.  She is a candidate for GLP-1 agonist treatment, discussed this today.  She agrees.  Start semaglutide [facility]) for weight loss. Start by injecting 0.25 mg into the skin once weekly for 4 weeks, then increase to 0.5 mg once weekly thereafter.  If Reginal Lutes is not covered by her insurance then we will try Zepbound.  If Zepbound is not covered then we will place a referral to healthy weight and wellness.  Checking labs today including TSH, lipids.  A1c with prediabetes today.  Follow-up in 3 months.

## 2023-03-03 NOTE — Assessment & Plan Note (Signed)
Stable.  Prescription provided for Maxalt 5 mg to use as needed. Continue Excedrin Migraine on an infrequent basis.  Discussed effects of recurrent Excedrin Migraine use.

## 2023-03-03 NOTE — Patient Instructions (Addendum)
Stop by the lab prior to leaving today. I will notify you of your results once received.   Work on [employer] by limiting fast food and juice.  Start semaglutide [facility]) for weight loss. Start by injecting 0.25 mg into the skin once weekly for 4 weeks, then increase to 0.5 mg once weekly thereafter. Please notify me once you've used your last 0.25 mg pen so that I can prescribe the next dose.   You will either be contacted via phone regarding your referral to [facility] for the sleep study, or you may receive a letter on your MyChart portal from our referral team with instructions for scheduling an appointment. Please let us know if you have not been contacted by anyone within two weeks.  You may take the rizatriptan (Maxalt) as needed for migraines.  This will cause drowsiness.  Try the Epley's maneuvers as needed for dizziness.  Take amlodipine 5 mg every day for blood pressure.  It was a pleasure to meet you today! Please don't hesitate to contact me with any questions. Welcome to [facility]!

## 2023-03-03 NOTE — Assessment & Plan Note (Signed)
New diagnosis with A1c of 5.7 today. Discussed to work on diet.  Will start GLP-1 agonist treatment. Follow-up in 3 months.

## 2023-03-03 NOTE — Assessment & Plan Note (Signed)
Controlled.  Long discussion today regarding proper instructions for antihypertensive treatment. Start by taking amlodipine 5 mg on a daily basis. Discontinue losartan 100 mg for now.  She will monitor blood pressure and update.

## 2023-03-03 NOTE — Progress Notes (Signed)
Subjective:    Patient ID: Michelle Thomas, female    DOB: 1975-06-10, 47 y.o.   MRN: [id number]  HPI  Michelle Thomas is a very pleasant 47 y.o. female who presents today who presents today to establish care and discuss the problems mentioned below. Will obtain/review records.  1) Hypertension: Currently managed on amlodipine 5 mg, losartan 100 mg for which she takes every 2-3 days. She doesn't take her pills daily, only if she feels that her BP is elevated. These symptoms include difficulty focusing and dizziness.   She checks her BP at home on occasion which runs 130-140-80-90s.   She does experience lightheadedness/dizziness on a daily basis, occurs with sudden head movements and sitting at her desk at work, feels like the room is spinning.   BP Readings from Last 3 Encounters:  03/03/23 124/86  08/16/22 130/84  03/19/22 (!) 149/89     2) Migraines: Chronic. Currently not managed on treatment.  Previously managed on topiramate for migraine prevention and rizatriptan for migraine abortion.  Headaches are located to the frontal lobes which occurs 1-2 times monthly lasting for hours. Currently taking Excedrin Migraine which resolves her headaches. Her last full migraine was several months ago.  She does not have a current prescription for Maxalt at home.  3) Insomnia: Chronic. Currently managed on Trazodone 50 mg for which she takes as needed for staying asleep.  She denies difficulty falling asleep, but does experience difficulty staying asleep.  She will sometimes wake every 2 hours during the night.  She will go to bed around 10 pm-12 am, she will wake up at 2 am, 4 am, sometimes each hour. She also gets up to use the bathroom 1 to several times nightly.   She lives by herself, has not been told that she snores. She has daytime tiredness. She has a family history of diabetes. She does have polydipsia at times, polyuria daily, polyphagia.   4) Class 3 Obesity:  Chronic history of obesity as long as she can remember.   She has seen a dietician, tried calorie counting, weight loss APPs with temporary improvement.  She is requesting help with weight management today.  Diet currently consists of:  Breakfast: Skips, sometimes fast food Lunch: Fast food or left overs Dinner: Skips, sometimes cereal Snacks: Chips, honey buns Desserts: Several times weekly  Beverages: Juice, water   Exercise: Walking some    Wt Readings from Last 3 Encounters:  03/03/23 (!) 314 lb (142.4 kg)  10/05/20 (!) 302 lb (137 kg)  08/12/19 (!) 302 lb (137 kg)      Review of Systems  Respiratory:  Negative for shortness of breath.   Cardiovascular:  Negative for chest pain.  Gastrointestinal:  Negative for constipation and diarrhea.  Endocrine: Positive for polydipsia, polyphagia and polyuria.  Neurological:  Positive for light-headedness. Negative for headaches.         Past Medical History:  Diagnosis Date   Hypertension    Migraine without aura, without mention of intractable migraine without mention of status migrainosus 06/18/2012   Obesity     Social History   Socioeconomic History   Marital status: Single    Spouse name: Not on file   Number of children: 0   Years of education: BA   Highest education level: Bachelor's degree (e.g., BA, AB, BS)  Occupational History   Occupation: Geophysical data processor    Employer: [employer]  Tobacco Use   Smoking status: Never  Smokeless tobacco: Never  Vaping Use   Vaping status: Never Used  Substance and Sexual Activity   Alcohol use: Yes    Comment: occasionally   Drug use: No   Sexual activity: Yes    Birth control/protection: None    Comment: partner vasectomy  Other Topics Concern   Not on file  Social History Narrative   Not on file   Social Determinants of Health   Financial Resource Strain: Low Risk  (03/03/2023)   Overall Financial Resource Strain (CARDIA)    Difficulty of  Paying Living Expenses: Not hard at all  Food Insecurity: No Food Insecurity (03/03/2023)   Hunger Vital Sign    Worried About Running Out of Food in the Last Year: Never true    Ran Out of Food in the Last Year: Never true  Transportation Needs: No Transportation Needs (03/03/2023)   PRAPARE - Administrator, Civil Service (Medical): No    Lack of Transportation (Non-Medical): No  Physical Activity: Insufficiently Active (03/03/2023)   Exercise Vital Sign    Days of Exercise per Week: 2 days    Minutes of Exercise per Session: 30 min  Stress: Stress Concern Present (03/03/2023)   [employer] of Occupational Health - Occupational Stress Questionnaire    Feeling of Stress : To some extent  Social Connections: Socially Isolated (03/03/2023)   Social Connection and Isolation Panel [NHANES]    Frequency of Communication with Friends and Family: More than three times a week    Frequency of Social Gatherings with Friends and Family: Once a week    Attends Religious Services: Never    Database administrator or Organizations: No    Attends Engineer, structural: Not on file    Marital Status: Never married  Intimate Partner Violence: Not on file    Past Surgical History:  Procedure Laterality Date   LAPAROSCOPY N/A 05/03/2018   Procedure: LAPAROSCOPY DIAGNOSTIC,;  Surgeon: Richardean Chimera, MD;  Location: [facility];  Service: Gynecology;  Laterality: N/A;   OVARIAN CYST REMOVAL Left 05/03/2018   Procedure: LEFT OVARIAN CYSTECTOMY;  Surgeon: Richardean Chimera, MD;  Location: [facility] ;  Service: Gynecology;  Laterality: Left;   PLANTAR FASCIA RELEASE Left    WISDOM TOOTH EXTRACTION      Family History  Problem Relation Age of Onset   Hypertension Mother    Hypertension Father    Diabetes Father    Cancer Father        colon cancer   Headache Sister    Diabetes Sister    Breast cancer Cousin     Allergies  Allergen Reactions    Codeine     Rash   Penicillin V     Other reaction(s): unknown   Penicillins Cross Reactors Hives   Sumatriptan     Other reaction(s): burning sensation, sweating    Current Outpatient Medications on File Prior to Visit  Medication Sig Dispense Refill   aspirin-acetaminophen-caffeine (EXCEDRIN MIGRAINE) 250-250-65 MG per tablet Take 1 tablet by mouth every 6 (six) hours as needed for pain.     ondansetron (ZOFRAN-ODT) 4 MG disintegrating tablet Take 1 tablet (4 mg total) by mouth every 8 (eight) hours as needed for nausea or vomiting. 20 tablet 0   traZODone (DESYREL) 50 MG tablet Take 50 mg by mouth at bedtime as needed.     No current facility-administered medications on file prior to visit.    BP 124/86  Pulse 82   Temp (!) 97.5 F (36.4 C) (Temporal)   Ht 5\' 10"  (1.778 m)   Wt (!) 314 lb (142.4 kg)   SpO2 100%   BMI 45.05 kg/m  Objective:   Physical Exam Cardiovascular:     Rate and Rhythm: Normal rate and regular rhythm.  Pulmonary:     Effort: Pulmonary effort is normal.     Breath sounds: Normal breath sounds.  Abdominal:     General: Bowel sounds are normal.     Palpations: Abdomen is soft.     Tenderness: There is no abdominal tenderness.  Musculoskeletal:     Cervical back: Neck supple.  Skin:    General: Skin is warm and dry.  Neurological:     Mental Status: She is alert and oriented to person, place, and time.  Psychiatric:        Mood and Affect: Mood normal.           Assessment & Plan:  Sleep disturbance Assessment & Plan: Symptoms suspicious for sleep apnea.  Referral placed to [facility] for sleep study. Continue trazodone 50 mg as needed.  Orders: -     Ambulatory referral to Sleep Studies  Polydipsia -     POCT glycosylated hemoglobin (Hb A1C)  Essential hypertension Assessment & Plan: Controlled.  Long discussion today regarding proper instructions for antihypertensive treatment. Start by taking amlodipine 5 mg on a  daily basis. Discontinue losartan 100 mg for now.  She will monitor blood pressure and update.   Orders: -     amLODIPine Besylate; Take 1 tablet (5 mg total) by mouth daily. for blood pressure.  Dispense: 90 tablet; Refill: 3 -     Comprehensive metabolic panel -     TSH -     CBC -     Wegovy; Inject 0.25 mg into the skin once a week.  Dispense: 2 mL; Refill: 0  Migraine without aura and without status migrainosus, not intractable Assessment & Plan: Stable.  Prescription provided for Maxalt 5 mg to use as needed. Continue Excedrin Migraine on an infrequent basis.  Discussed effects of recurrent Excedrin Migraine use.  Orders: -     Rizatriptan Benzoate; Take 1 tablet by mouth at migraine onset. May repeat in 2 hours if needed  Dispense: 10 tablet; Refill: 0  Class 3 severe obesity due to excess calories without serious comorbidity with body mass index (BMI) of 45.0 to 49.9 in adult [facility]) Assessment & Plan: Poor diet, lack of consistent physical activity. Long discussion today regarding her obesity and changes that could be made.  Limit fast food/takeout food and juice.  She is a candidate for GLP-1 agonist treatment, discussed this today.  She agrees.  Start semaglutide [facility]) for weight loss. Start by injecting 0.25 mg into the skin once weekly for 4 weeks, then increase to 0.5 mg once weekly thereafter.  If Reginal Lutes is not covered by her insurance then we will try Zepbound.  If Zepbound is not covered then we will place a referral to healthy weight and wellness.  Checking labs today including TSH, lipids.  A1c with prediabetes today.  Follow-up in 3 months.   Orders: -     Wegovy; Inject 0.25 mg into the skin once a week.  Dispense: 2 mL; Refill: 0  Prediabetes Assessment & Plan: New diagnosis with A1c of 5.7 today. Discussed to work on diet.  Will start GLP-1 agonist treatment. Follow-up in 3 months.  Orders: -  Lipid panel -     Wegovy; Inject 0.25 mg into  the skin once a week.  Dispense: 2 mL; Refill: 0        Doreene Nest, NP

## 2023-03-04 ENCOUNTER — Other Ambulatory Visit: Payer: Self-pay | Admitting: Primary Care

## 2023-03-04 ENCOUNTER — Ambulatory Visit (INDEPENDENT_AMBULATORY_CARE_PROVIDER_SITE_OTHER): Payer: 59

## 2023-03-04 DIAGNOSIS — D649 Anemia, unspecified: Secondary | ICD-10-CM | POA: Diagnosis not present

## 2023-03-04 LAB — IBC + FERRITIN
Ferritin: 136.3 ng/mL (ref 10.0–291.0)
Iron: 52 ug/dL (ref 42–145)
Saturation Ratios: 14.9 % — ABNORMAL LOW (ref 20.0–50.0)
TIBC: 348.6 ug/dL (ref 250.0–450.0)
Transferrin: 249 mg/dL (ref 212.0–360.0)

## 2023-03-05 ENCOUNTER — Encounter: Payer: Self-pay | Admitting: *Deleted

## 2023-03-05 ENCOUNTER — Other Ambulatory Visit (HOSPITAL_COMMUNITY): Payer: Self-pay

## 2023-03-06 NOTE — Telephone Encounter (Signed)
Called and spoke with pharmacy, patients insurance will only cover a 30 day supply at a time

## 2023-03-06 NOTE — Telephone Encounter (Signed)
Can we call her pharmacy to find out what is going on with her amlodipine 5 mg tablets?  Are they just out of stock of amlodipine 5?  She was only given a 30-day supply.

## 2023-03-26 ENCOUNTER — Telehealth (HOSPITAL_COMMUNITY): Payer: Self-pay | Admitting: Pharmacy Technician

## 2023-03-26 ENCOUNTER — Other Ambulatory Visit (HOSPITAL_COMMUNITY): Payer: Self-pay

## 2023-03-26 NOTE — Telephone Encounter (Signed)
Pharmacy Patient Advocate Encounter   Received notification from CoverMyMeds that prior authorization for [facility] 0.25MG /0.5ML auto-injectors is required/requested.   Insurance verification completed.   The patient is insured through [facility] .   Per test claim: PA required; PA submitted to above mentioned insurance via CoverMyMeds Key/confirmation #/EOC [id number] Status is pending

## 2023-03-31 ENCOUNTER — Ambulatory Visit
Admission: EM | Admit: 2023-03-31 | Discharge: 2023-03-31 | Disposition: A | Discharge status: Home / Self Care | Payer: 59 | Attending: Emergency Medicine | Admitting: Emergency Medicine

## 2023-03-31 DIAGNOSIS — G43009 Migraine without aura, not intractable, without status migrainosus: Secondary | ICD-10-CM

## 2023-03-31 MED ORDER — KETOROLAC TROMETHAMINE 30 MG/ML IJ SOLN
30.0000 mg | Freq: Once | INTRAMUSCULAR | Status: AC
  Administered 2023-03-31: 30 mg via INTRAMUSCULAR

## 2023-03-31 MED ORDER — ONDANSETRON 8 MG PO TBDP
8.0000 mg | ORAL_TABLET | Freq: Once | ORAL | Status: AC
  Administered 2023-03-31: 8 mg via ORAL

## 2023-03-31 NOTE — ED Provider Notes (Signed)
 Michelle Thomas    CSN: [id number] Arrival date & time: 03/31/23  9176      History   Chief Complaint Chief Complaint  Patient presents with   Migraine    HPI Michelle Thomas  is a 47 y.o. female.   Presents for evaluation of a right sided frontal headache occurring intermittently over the past 2 days, progressively worsening.  Associated light and noise sensitivity and nausea without vomiting.  Has attempted use of Excedrin and rizatriptan  which has provided no relief.  Denies vomiting, blurred vision, dizziness, lightheadedness, memory or speech changes or weakness.  History of migraines.  Past Medical History:  Diagnosis Date   Hypertension    Migraine without aura, without mention of intractable migraine without mention of status migrainosus 06/18/2012   Obesity     Patient Active Problem List   Diagnosis Date Noted   Sleep disturbance 03/03/2023   Essential hypertension 03/03/2023   Class 3 severe obesity due to excess calories without serious comorbidity with body mass index (BMI) of 45.0 to 49.9 in adult [facility]) 03/03/2023   Prediabetes 03/03/2023   Migraines 06/18/2012    Past Surgical History:  Procedure Laterality Date   LAPAROSCOPY N/A 05/03/2018   Procedure: LAPAROSCOPY DIAGNOSTIC,;  Surgeon: Michelle Rush, MD;  Location: [facility];  Service: Gynecology;  Laterality: N/A;   OVARIAN CYST REMOVAL Left 05/03/2018   Procedure: LEFT OVARIAN CYSTECTOMY;  Surgeon: Michelle Rush, MD;  Location: [facility] [place];  Service: Gynecology;  Laterality: Left;   PLANTAR FASCIA RELEASE Left    WISDOM TOOTH EXTRACTION      OB History   No obstetric history on file.      Home Medications    Prior to Admission medications   Medication Sig Start Date End Date Taking? Authorizing Provider  amLODipine  (NORVASC ) 5 MG tablet Take 1 tablet (5 mg total) by mouth daily. for blood pressure. 03/03/23   Clark, Michelle Thomas   aspirin-acetaminophen -caffeine  (EXCEDRIN MIGRAINE) 250-250-65 MG per tablet Take 1 tablet by mouth every 6 (six) hours as needed for pain.    [provider]  ondansetron  (ZOFRAN -ODT) 4 MG disintegrating tablet Take 1 tablet (4 mg total) by mouth every 8 (eight) hours as needed for nausea or vomiting. 03/19/22   Michelle Burnard DEL, Thomas  rizatriptan  (MAXALT ) 5 MG tablet Take 1 tablet by mouth at migraine onset. May repeat in 2 hours if needed 03/03/23   Clark, Michelle Thomas  Semaglutide -Weight Management (WEGOVY ) 0.25 MG/0.5ML SOAJ Inject 0.25 mg into the skin once a week. 03/03/23   Clark, Michelle Thomas  traZODone  (DESYREL ) 50 MG tablet Take 50 mg by mouth at bedtime as needed. 02/05/21   [provider]    Family History Family History  Problem Relation Age of Onset   Hypertension Mother    Hypertension Father    Diabetes Father    Cancer Father        colon cancer   Headache Sister    Diabetes Sister    Breast cancer Cousin     Social History Social History   Tobacco Use   Smoking status: Never   Smokeless tobacco: Never  Vaping Use   Vaping status: Never Used  Substance Use Topics   Alcohol use: Yes    Comment: occasionally   Drug use: No     Allergies   Codeine, Penicillin v, Penicillins cross reactors, and Sumatriptan    Review of Systems Review of Systems   Physical  Exam Triage Vital Signs ED Triage Vitals  Encounter Vitals Group     BP 03/31/23 0905 (!) 158/91     Systolic BP Percentile --      Diastolic BP Percentile --      Pulse Rate 03/31/23 0905 77     Resp 03/31/23 0905 18     Temp 03/31/23 0905 98.7 F (37.1 C)     Temp Source 03/31/23 0905 Oral     SpO2 03/31/23 0905 95 %     Weight --      Height --      Head Circumference --      Peak Flow --      Pain Score 03/31/23 0901 8     Pain Loc --      Pain Education --      Exclude from Growth Chart --    No data found.  Updated Vital Signs BP (!) 158/91 (BP Location: Left  Arm)   Pulse 77   Temp 98.7 F (37.1 C) (Oral)   Resp 18   LMP 03/05/2023   SpO2 95%   Visual Acuity Right Eye Distance:   Left Eye Distance:   Bilateral Distance:    Right Eye Near:   Left Eye Near:    Bilateral Near:     Physical Exam Constitutional:      Appearance: Normal appearance.  Eyes:     Extraocular Movements: Extraocular movements intact.     Conjunctiva/sclera: Conjunctivae normal.     Pupils: Pupils are equal, round, and reactive to light.  Pulmonary:     Effort: Pulmonary effort is normal.  Neurological:     General: No focal deficit present.     Mental Status: She is alert and oriented to person, place, and time. Mental status is at baseline.     Cranial Nerves: No cranial nerve deficit.     Sensory: No sensory deficit.     Motor: No weakness.     Coordination: Coordination normal.     Gait: Gait normal.      UC Treatments / Results  Labs (all labs ordered are listed, but only abnormal results are displayed) Labs Reviewed - No data to display  EKG   Radiology No results found.  Procedures Procedures (including critical care time)  Medications Ordered in UC Medications  ondansetron  (ZOFRAN -ODT) disintegrating tablet 8 mg (8 mg Oral Given 03/31/23 0929)  ketorolac  (TORADOL ) 30 MG/ML injection 30 mg (30 mg Intramuscular Given 03/31/23 0929)    Initial Impression / Assessment and Plan / UC Course  I have reviewed the triage vital signs and the nursing notes.  Pertinent labs & imaging results that were available during my care of the patient were reviewed by me and considered in my medical decision making (see chart for details).  Migraine without aura and without status migrainosus, not intractable  Vital signs are stable, patient visibly uncomfortable, no neurological deficits, Toradol  IM and oral Zofran  given in clinic, has had success with this combination in the past, recommended supportive care and continued use of prescribed  medication with follow-up with PCP if symptoms continue to persist or any worsening symptoms patient g instructed to go to the nearest emergency department for immediate evaluation Final Clinical Impressions(s) / UC Diagnoses   Final diagnoses:  Migraine without aura and without status migrainosus, not intractable     Discharge Instructions      For your headache -On exam there are no abnormalities neurologically -You have been given  an injection of Toradol  and oral zofran  here in the office today to help minimize your symptoms -You may continue use of Excedrin and your triptan  for management at home -While headaches are present ensure that you are getting adequate rest and adequate fluid intake -Participate in low stimulation activities avoiding bright lights and loud noises when symptoms are present -If your headaches continue to persist please follow-up with your primary doctor for reevaluation -At any point if you have the worst headache of your life please go to the nearest emergency department for immediate evaluation    ED Prescriptions   None    PDMP not reviewed this encounter.   Teresa Shelba SAUN[place], [place] 03/31/23 [phone]

## 2023-03-31 NOTE — ED Triage Notes (Signed)
 Patient to Urgent Care with complaints of a migraine headache w/ watering eyes, pain is right sided for two days.  Reports symptoms started two days ago. Hx of migraines.   Reports taking excedrin migraine (two doses)/ rizatriptan.

## 2023-03-31 NOTE — Discharge Instructions (Signed)
 For your headache -On exam there are no abnormalities neurologically -You have been given an injection of Toradol  and oral zofran  here in the office today to help minimize your symptoms -You may continue use of Excedrin and your triptan  for management at home -While headaches are present ensure that you are getting adequate rest and adequate fluid intake -Participate in low stimulation activities avoiding bright lights and loud noises when symptoms are present -If your headaches continue to persist please follow-up with your primary doctor for reevaluation -At any point if you have the worst headache of your life please go to the nearest emergency department for immediate evaluation

## 2023-04-09 ENCOUNTER — Ambulatory Visit: Payer: 59 | Admitting: Primary Care

## 2023-04-09 VITALS — BP 126/78 | HR 87 | Temp 98.7°F | Ht 70.0 in | Wt 318.0 lb

## 2023-04-09 DIAGNOSIS — G43009 Migraine without aura, not intractable, without status migrainosus: Secondary | ICD-10-CM

## 2023-04-09 MED ORDER — KETOROLAC TROMETHAMINE 60 MG/2ML IM SOLN
60.0000 mg | Freq: Once | INTRAMUSCULAR | Status: AC
  Administered 2023-04-09: 60 mg via INTRAMUSCULAR

## 2023-04-09 NOTE — Addendum Note (Signed)
 Addended by: Donnamarie Poag on: 04/09/2023 10:35 AM   Modules accepted: Orders

## 2023-04-09 NOTE — Progress Notes (Signed)
 Subjective:    Patient ID: Michelle Thomas , female    DOB: 1975/09/23, 48 y.o.   MRN: [id number]  Headache  Associated symptoms include numbness. Pertinent negatives include no photophobia.    Michelle Thomas  is a very pleasant 48 y.o. female with a history of migraines, hypertension, sleep disturbance, prediabetes who presents today to discuss migraine.  She was evaluated at urgent care on 03/31/23 for a two day history of right sided frontal headache that was worsening. No improvement with Excedrin Migraine or rizatriptan . During her urgent care visit she was treated with Zofran  ODT, Toradol  IM 30 mg.   Since her urgent care visit her migraine has improved but never resolved. She continues to experience a headache to the right frontal lobe for which is constantly dull, but increases in intensity with movement. She's recently noticed dizziness, right sided facial numbness, right eye tearing. She denies photophobia and phonophobia.    BP Readings from Last 3 Encounters:  04/09/23 126/78  03/31/23 (!) 158/91  03/03/23 124/86     Review of Systems  Eyes:  Negative for photophobia.  Neurological:  Positive for numbness and headaches.         Past Medical History:  Diagnosis Date   Hypertension    Migraine without aura, without mention of intractable migraine without mention of status migrainosus 06/18/2012   Obesity     Social History   Socioeconomic History   Marital status: Single    Spouse name: Not on file   Number of children: 0   Years of education: BA   Highest education level: Bachelor's degree (e.g., BA, AB, BS)  Occupational History   Occupation: Hydrologist: [employer]  Tobacco Use   Smoking status: Never   Smokeless tobacco: Never  Vaping Use   Vaping status: Never Used  Substance and Sexual Activity   Alcohol use: Yes    Comment: occasionally   Drug use: No   Sexual activity: Yes    Birth  control/protection: None    Comment: partner vasectomy  Other Topics Concern   Not on file  Social History Narrative   Not on file   Social Drivers of Health   Financial Resource Strain: Low Risk  (04/09/2023)   Overall Financial Resource Strain (CARDIA)    Difficulty of Paying Living Expenses: Not very hard  Food Insecurity: No Food Insecurity (04/09/2023)   Hunger Vital Sign    Worried About Running Out of Food in the Last Year: Never true    Ran Out of Food in the Last Year: Never true  Transportation Needs: No Transportation Needs (04/09/2023)   PRAPARE - Administrator, Civil Service (Medical): No    Lack of Transportation (Non-Medical): No  Physical Activity: Insufficiently Active (04/09/2023)   Exercise Vital Sign    Days of Exercise per Week: 2 days    Minutes of Exercise per Session: 20 min  Stress: Stress Concern Present (04/09/2023)   [employer] of Occupational Health - Occupational Stress Questionnaire    Feeling of Stress : To some extent  Social Connections: Moderately Isolated (04/09/2023)   Social Connection and Isolation Panel [NHANES]    Frequency of Communication with Friends and Family: More than three times a week    Frequency of Social Gatherings with Friends and Family: Once a week    Attends Religious Services: Never    Database Administrator or Organizations: Yes    Attends [employer]  or Organization Meetings: Never    Marital Status: Never married  Intimate Partner Violence: Not on file    Past Surgical History:  Procedure Laterality Date   LAPAROSCOPY N/A 05/03/2018   Procedure: LAPAROSCOPY DIAGNOSTIC,;  Surgeon: Leva Rush, MD;  Location: [facility] [place];  Service: Gynecology;  Laterality: N/A;   OVARIAN CYST REMOVAL Left 05/03/2018   Procedure: LEFT OVARIAN CYSTECTOMY;  Surgeon: Leva Rush, MD;  Location: [facility];  Service: Gynecology;  Laterality: Left;   PLANTAR FASCIA RELEASE Left    WISDOM TOOTH EXTRACTION       Family History  Problem Relation Age of Onset   Hypertension Mother    Hypertension Father    Diabetes Father    Cancer Father        colon cancer   Headache Sister    Diabetes Sister    Breast cancer Cousin     Allergies  Allergen Reactions   Codeine     Rash   Penicillin V     Other reaction(s): unknown   Penicillins Cross Reactors Hives   Sumatriptan      Other reaction(s): burning sensation, sweating    Current Outpatient Medications on File Prior to Visit  Medication Sig Dispense Refill   amLODipine  (NORVASC ) 5 MG tablet Take 1 tablet (5 mg total) by mouth daily. for blood pressure. 90 tablet 3   aspirin-acetaminophen -caffeine  (EXCEDRIN MIGRAINE) 250-250-65 MG per tablet Take 1 tablet by mouth every 6 (six) hours as needed for pain.     ondansetron  (ZOFRAN -ODT) 4 MG disintegrating tablet Take 1 tablet (4 mg total) by mouth every 8 (eight) hours as needed for nausea or vomiting. 20 tablet 0   rizatriptan  (MAXALT ) 5 MG tablet Take 1 tablet by mouth at migraine onset. May repeat in 2 hours if needed 10 tablet 0   traZODone  (DESYREL ) 50 MG tablet Take 50 mg by mouth at bedtime as needed.     No current facility-administered medications on file prior to visit.    BP 126/78   Pulse 87   Temp 98.7 F (37.1 C) (Oral)   Ht 5' 10 (1.778 m)   Wt (!) 318 lb (144.2 kg)   LMP 03/05/2023   SpO2 97%   BMI 45.63 kg/m  Objective:   Physical Exam Eyes:     Extraocular Movements: Extraocular movements intact.  Cardiovascular:     Rate and Rhythm: Normal rate and regular rhythm.  Pulmonary:     Effort: Pulmonary effort is normal.     Breath sounds: Normal breath sounds.  Musculoskeletal:     Cervical back: Neck supple.  Skin:    General: Skin is warm and dry.  Neurological:     Mental Status: She is alert and oriented to person, place, and time.     Cranial Nerves: No cranial nerve deficit.     Coordination: Coordination normal.  Psychiatric:        Mood and  Affect: Mood normal.           Assessment & Plan:  Migraine without aura and without status migrainosus, not intractable Assessment & Plan: Improved, but uncontrolled at this moment.  Toradol  60 mg provided intramuscularly today.  We discussed for her to take 10 mg of rizatriptan  later this afternoon if no improvement in her headache within a few hours. She will update tomorrow.           Jordyan Hardiman K Daianna Vasques, NP

## 2023-04-09 NOTE — Assessment & Plan Note (Signed)
 Improved, but uncontrolled at this moment.  Toradol 60 mg provided intramuscularly today.  We discussed for her to take 10 mg of rizatriptan later this afternoon if no improvement in her headache within a few hours. She will update tomorrow.

## 2023-04-09 NOTE — Patient Instructions (Signed)
 Take 2 rizatriptan tablets later this afternoon/evening if your headache persist. Please update me tomorrow!  Ask your employer if they cover Zepbound or Saxenda for weight loss.  It was a pleasure to see you today!

## 2023-04-22 NOTE — Telephone Encounter (Signed)
Pharmacy Patient Advocate Encounter  Received notification from [facility] that Prior Authorization for [facility] 0.25MG /0.5ML auto-injectors has been DENIED.  The request for coverage for WEGOVY INJ 0.25MG , use as directed (2mL per month), is denied.   This medicine is covered only if: All of the following: (1) Submission of medical records documenting you have established cardiovascular disease as evidenced by one of the following: (I) Prior myocardial infarction. (II) Prior ischemic or hemorrhagic stroke. (III) Symptomatic peripheral arterial disease evidenced by one of the following: (a) Intermittent claudication with ankle-brachial index less than 0.85 (at rest). (b) Peripheral arterial revascularization procedure. (c) Amputation due to atherosclerotic disease. (2) One of the following: (A) If you have history of myocardial infarction: (I) You are on therapy from each of the following classes unless there is a contraindication or intolerance: (a) Cholesterol lowering medication (for example: statin, proprotein convertase subtilisin/kexin type 9 inhibitor). (b) Beta blocker (they are, carvedilol, metoprolol, or bisoprolol). (c) Angiotensin-converting enzyme inhibitor, angiotensin II receptor blocker or angiotensin II receptor blocker neprilysin inhibitor. (d) Antiplatelet (for example: aspirin, clopidogrel). (B) If you have history of ischemic or hemorrhagic stroke: (I) You are on therapy from each of the following classes unless there is a contraindication or intolerance: (a) Cholesterol lowering medication (for example: statin, proprotein convertase subtilisin/kexin type 9 inhibitor). (b) Angiotensin-converting enzyme inhibitor, angiotensin II receptor blocker or angiotensin II receptor blocker neprilysin inhibitor. (c) Antiplatelet (for example: aspirin, clopidogrel). (C) If you have history of symptomatic peripheral artery disease: (I) You are on therapy from each of the following  classes unless there is a contraindication or intolerance: (a) Cholesterol lowering medication (for example: statin, proprotein convertase subtilisin/kexin type 9 inhibitor). (b) Angiotensin-converting enzyme inhibitor, angiotensin II receptor blocker or angiotensin II receptor blocker neprilysin inhibitor. (c) Antiplatelet (for example: aspirin, clopidogrel). The information provided does not show that you meet the criteria listed above  PA #/Case ID/Reference #: [id number]

## 2023-04-30 ENCOUNTER — Encounter: Payer: Self-pay | Admitting: Primary Care

## 2023-07-29 ENCOUNTER — Ambulatory Visit: Admitting: Primary Care

## 2023-07-29 ENCOUNTER — Other Ambulatory Visit: Payer: Self-pay | Admitting: Primary Care

## 2023-07-29 ENCOUNTER — Encounter: Payer: Self-pay | Admitting: Primary Care

## 2023-07-29 VITALS — BP 138/82 | HR 92 | Temp 98.1°F | Ht 70.0 in | Wt 298.4 lb

## 2023-07-29 DIAGNOSIS — G8929 Other chronic pain: Secondary | ICD-10-CM

## 2023-07-29 DIAGNOSIS — R202 Paresthesia of skin: Secondary | ICD-10-CM | POA: Diagnosis not present

## 2023-07-29 DIAGNOSIS — M2550 Pain in unspecified joint: Secondary | ICD-10-CM | POA: Diagnosis not present

## 2023-07-29 DIAGNOSIS — D649 Anemia, unspecified: Secondary | ICD-10-CM

## 2023-07-29 DIAGNOSIS — M6281 Muscle weakness (generalized): Secondary | ICD-10-CM

## 2023-07-29 DIAGNOSIS — R7303 Prediabetes: Secondary | ICD-10-CM | POA: Diagnosis not present

## 2023-07-29 DIAGNOSIS — N62 Hypertrophy of breast: Secondary | ICD-10-CM | POA: Insufficient documentation

## 2023-07-29 DIAGNOSIS — R29898 Other symptoms and signs involving the musculoskeletal system: Secondary | ICD-10-CM | POA: Insufficient documentation

## 2023-07-29 DIAGNOSIS — Z87898 Personal history of other specified conditions: Secondary | ICD-10-CM

## 2023-07-29 DIAGNOSIS — R053 Chronic cough: Secondary | ICD-10-CM | POA: Insufficient documentation

## 2023-07-29 HISTORY — DX: Chronic cough: R05.3

## 2023-07-29 LAB — VITAMIN D 25 HYDROXY (VIT D DEFICIENCY, FRACTURES): VITD: 23.18 ng/mL — ABNORMAL LOW (ref 30.00–100.00)

## 2023-07-29 LAB — CBC
HCT: 37.6 % (ref 36.0–46.0)
Hemoglobin: 11.9 g/dL — ABNORMAL LOW (ref 12.0–15.0)
MCHC: 31.6 g/dL (ref 30.0–36.0)
MCV: 72.9 fl — ABNORMAL LOW (ref 78.0–100.0)
Platelets: 264 10*3/uL (ref 150.0–400.0)
RBC: 5.16 Mil/uL — ABNORMAL HIGH (ref 3.87–5.11)
RDW: 16.7 % — ABNORMAL HIGH (ref 11.5–15.5)
WBC: 8 10*3/uL (ref 4.0–10.5)

## 2023-07-29 LAB — C-REACTIVE PROTEIN: CRP: 2.1 mg/dL (ref 0.5–20.0)

## 2023-07-29 LAB — VITAMIN B12: Vitamin B-12: 254 pg/mL (ref 211–911)

## 2023-07-29 LAB — SEDIMENTATION RATE: Sed Rate: 101 mm/h — ABNORMAL HIGH (ref 0–20)

## 2023-07-29 LAB — HEMOGLOBIN A1C: Hgb A1c MFr Bld: 5.9 % (ref 4.6–6.5)

## 2023-07-29 NOTE — Assessment & Plan Note (Signed)
 Checking labs today including RF, CCP, sed rate, CRP, CBC. Await results.

## 2023-07-29 NOTE — Progress Notes (Addendum)
 Subjective:    Patient ID: Michelle Thomas , female    DOB: 04-22-1975, 48 y.o.   MRN: [id number]  HPI  Vlada A Grace  is a very pleasant 48 y.o. female with a history of hypertension, migraines, sleep disturbance, prediabetes who presents today to discuss several symptoms.  1) Weakness/Paresthesias: Over the last 3 months she's noticed muscle weakness to her bilateral upper extremities with numbness. Her numbness begins to the bilateral antecubital fossas down to her fingers. She's noticed difficulty holding onto objects, difficulty opening bottles, difficulty carrying groceries and carrying her purse. Also with visual changes, blurred vision, joint aches to her hands with joint swelling.   She does have a chronic history of intermittent lower extremity pain and weakness, no pain recently. History of chronic back pain.   She denies neck pain, extreme exercise, double vision. 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 She recently had an eye exam, vision had changed. She completed EMG testing to her upper extremities within in the last  6 months per orthopedics, was told that she had mild carpal syndrome.   She did complete a sleep study which showed moderate obstructive sleep apnea, did not want to start CPAP or inspire. She continues to experience fatigue, difficulty falling asleep, waking during the night. She does have a prescription for Trazodone  50 mg for which she hasn't taken recently.   She underwent MRI brain in 2021 which was normal.   2) Coughing Fits: Intermittent, dry cough that began 2-3 weeks ago. She will experience coughing fits during the day which occurs when moving from indoors to outdoors. Once she's indoors for a while her symptoms resolve. [place], Holiday representative has been going on which has created a lot of dust.   Other symptoms include post nasal drip. She denies a history of asthma. She does have a history of GERD,  denies recent symptoms.   3) Chronic Back Pain/Large Breasts: She is looking to a breast reduction through her insurance company due to her large breast size.  She struggled with large breasts for decades despite weight loss attempts.  Her large breasts cause her bilateral shoulder pain because the straps take into her shoulders.  Her large breasts also cause chronic thoracic and lower back pain due to the weight of her breasts.  She is currently a triple D cup but believes she may have been even more than this.  Her bras are quite costly due to the size.  She believes that her back pain and shoulder pain would improve significantly if she were to have a breast reduction.  BP Readings from Last 3 Encounters:  07/29/23 138/82  04/09/23 126/78  03/31/23 (!) 158/91      Review of Systems  Eyes:  Positive for visual disturbance.  Respiratory:  Negative for shortness of breath.   Cardiovascular:  Negative for chest pain.  Musculoskeletal:  Positive for arthralgias and back pain. Negative for neck pain and neck stiffness.  Neurological:  Positive for weakness and numbness.         Past Medical History:  Diagnosis Date   Hypertension    Migraine without aura, without mention of intractable migraine without mention of status migrainosus 06/18/2012   Obesity     Social History   Socioeconomic History   Marital status: Single    Spouse name: Not on file   Number of children: 0   Years of education: BA   Highest education level: Bachelor's  degree (e.g., BA, AB, BS)  Occupational History   Occupation: Hydrologist: [employer]  Tobacco Use   Smoking status: Never   Smokeless tobacco: Never  Vaping Use   Vaping status: Never Used  Substance and Sexual Activity   Alcohol use: Yes    Comment: occasionally   Drug use: No   Sexual activity: Yes    Birth control/protection: None    Comment: partner vasectomy  Other Topics Concern   Not on file   Social History Narrative   Not on file   Social Drivers of Health   Financial Resource Strain: Low Risk  (04/09/2023)   Overall Financial Resource Strain (CARDIA)    Difficulty of Paying Living Expenses: Not very hard  Food Insecurity: No Food Insecurity (04/09/2023)   Hunger Vital Sign    Worried About Running Out of Food in the Last Year: Never true    Ran Out of Food in the Last Year: Never true  Transportation Needs: No Transportation Needs (04/09/2023)   PRAPARE - Administrator, Civil Service (Medical): No    Lack of Transportation (Non-Medical): No  Physical Activity: Insufficiently Active (04/09/2023)   Exercise Vital Sign    Days of Exercise per Week: 2 days    Minutes of Exercise per Session: 20 min  Stress: Stress Concern Present (04/09/2023)   [employer] of Occupational Health - Occupational Stress Questionnaire    Feeling of Stress : To some extent  Social Connections: Moderately Isolated (04/09/2023)   Social Connection and Isolation Panel [NHANES]    Frequency of Communication with Friends and Family: More than three times a week    Frequency of Social Gatherings with Friends and Family: Once a week    Attends Religious Services: Never    Database administrator or Organizations: Yes    Attends Banker Meetings: Never    Marital Status: Never married  Intimate Partner Violence: Not on file    Past Surgical History:  Procedure Laterality Date   LAPAROSCOPY N/A 05/03/2018   Procedure: LAPAROSCOPY DIAGNOSTIC,;  Surgeon: Merryl Abraham, MD;  Location: [facility];  Service: Gynecology;  Laterality: N/A;   OVARIAN CYST REMOVAL Left 05/03/2018   Procedure: LEFT OVARIAN CYSTECTOMY;  Surgeon: Merryl Abraham, MD;  Location: [facility];  Service: Gynecology;  Laterality: Left;   PLANTAR FASCIA RELEASE Left    WISDOM TOOTH EXTRACTION      Family History  Problem Relation Age of Onset   Hypertension Mother     Hypertension Father    Diabetes Father    Cancer Father        colon cancer   Headache Sister    Diabetes Sister    Breast cancer Cousin     Allergies  Allergen Reactions   Codeine     Rash   Penicillin V     Other reaction(s): unknown   Penicillins Cross Reactors Hives   Sumatriptan      Other reaction(s): burning sensation, sweating    Current Outpatient Medications on File Prior to Visit  Medication Sig Dispense Refill   amLODipine  (NORVASC ) 5 MG tablet Take 1 tablet (5 mg total) by mouth daily. for blood pressure. 90 tablet 3   aspirin-acetaminophen -caffeine  (EXCEDRIN MIGRAINE) 250-250-65 MG per tablet Take 1 tablet by mouth every 6 (six) hours as needed for pain.     ondansetron  (ZOFRAN -ODT) 4 MG disintegrating tablet Take 1 tablet (4 mg total) by  mouth every 8 (eight) hours as needed for nausea or vomiting. 20 tablet 0   rizatriptan  (MAXALT ) 5 MG tablet Take 1 tablet by mouth at migraine onset. May repeat in 2 hours if needed 10 tablet 0   traZODone  (DESYREL ) 50 MG tablet Take 50 mg by mouth at bedtime as needed.     No current facility-administered medications on file prior to visit.    BP 138/82   Pulse 92   Temp 98.1 F (36.7 C) (Temporal)   Ht 5\' 10"  (1.778 m)   Wt 298 lb 6.4 oz (135.4 kg)   LMP 07/14/2023   SpO2 97%   BMI 42.82 kg/m  Objective:   Physical Exam Eyes:     Extraocular Movements: Extraocular movements intact.  Cardiovascular:     Rate and Rhythm: Normal rate and regular rhythm.  Pulmonary:     Effort: Pulmonary effort is normal.     Breath sounds: Normal breath sounds.  Musculoskeletal:     Right shoulder: Normal.     Left shoulder: Normal.     Right hand: No swelling. Normal range of motion.     Left hand: No swelling. Normal range of motion.     Cervical back: Neck supple.     Comments: Decreased grip strength to left hand compared to right.   5/5 strength to bilateral upper extremities.  Skin:    General: Skin is warm and dry.   Neurological:     Mental Status: She is alert and oriented to person, place, and time.     Cranial Nerves: No cranial nerve deficit.  Psychiatric:        Mood and Affect: Mood normal.           Assessment & Plan:  Paresthesias Assessment & Plan: Differentials include carpal tunnel syndrome, tendinitis, vitamin B12 deficiency, neurological disorder such as MS.  Checking labs today including vitamin B12, vitamin D , CBC, rheumatoid factor studies. Consider repeat MRI brain.  Reviewed MRI brain from 2021.  Orders: -     Vitamin B12 -     VITAMIN D  25 Hydroxy (Vit-D Deficiency, Fractures) -     CBC -     Hemoglobin A1c  Arthralgia, unspecified joint Assessment & Plan: Checking labs today including RF, CCP, sed rate, CRP, CBC. Await results.   Orders: -     Rheumatoid factor -     Cyclic citrul peptide antibody, IgG -     C-reactive protein -     Sedimentation rate  Prediabetes -     Hemoglobin A1c  Upper extremity weakness Assessment & Plan: Differentials include carpal tunnel syndrome, tendinitis, vitamin B12 deficiency, neurological disorder such as MS.  Checking labs today including vitamin B12, vitamin D , CBC, rheumatoid factor studies. Consider repeat MRI brain.  Reviewed MRI brain from 2021.   Large breasts Assessment & Plan: Agree that her large breast size could be contributing to her shoulder and back pain. Agree that she would benefit from a breast reduction. She will contact her insurance company to find out neck steps regarding a breast reduction.  We will complete all necessary paperwork to approve the process.   Persistent cough for 3 weeks or longer Assessment & Plan: Symptoms seem to represent environmental/seasonal allergies. Exam today overall benign.  Start Zyrtec  10 mg at bedtime.  Consider famotidine 20 mg at bedtime.  She will update.         Aleecia Tapia K Dalton Mille, NP

## 2023-07-29 NOTE — Assessment & Plan Note (Signed)
 Agree that her large breast size could be contributing to her shoulder and back pain. Agree that she would benefit from a breast reduction. She will contact her insurance company to find out neck steps regarding a breast reduction.  We will complete all necessary paperwork to approve the process.

## 2023-07-29 NOTE — Assessment & Plan Note (Signed)
 Differentials include carpal tunnel syndrome, tendinitis, vitamin B12 deficiency, neurological disorder such as MS.  Checking labs today including vitamin B12, vitamin D , CBC, rheumatoid factor studies. Consider repeat MRI brain.  Reviewed MRI brain from 2021.

## 2023-07-29 NOTE — Assessment & Plan Note (Signed)
 Symptoms seem to represent environmental/seasonal allergies. Exam today overall benign.  Start Zyrtec  10 mg at bedtime.  Consider famotidine 20 mg at bedtime.  She will update.

## 2023-07-29 NOTE — Patient Instructions (Addendum)
 Stop by the lab prior to leaving today. I will notify you of your results once received.   Start taking Zyrtec  10 mg at bedtime for the coughing fits.  It was a pleasure to see you today!

## 2023-07-30 ENCOUNTER — Ambulatory Visit

## 2023-07-30 DIAGNOSIS — D649 Anemia, unspecified: Secondary | ICD-10-CM

## 2023-07-30 DIAGNOSIS — M2550 Pain in unspecified joint: Secondary | ICD-10-CM

## 2023-07-30 LAB — IBC + FERRITIN
Ferritin: 172.1 ng/mL (ref 10.0–291.0)
Iron: 78 ug/dL (ref 42–145)
Saturation Ratios: 25.1 % (ref 20.0–50.0)
TIBC: 310.8 ug/dL (ref 250.0–450.0)
Transferrin: 222 mg/dL (ref 212.0–360.0)

## 2023-07-30 LAB — WHITE CELL DIFFERENTIAL
Basophils Relative: 1.2 % (ref 0.0–3.0)
Eosinophils Relative: 2.6 % (ref 0.0–5.0)
Lymphocytes Relative: 26.4 % (ref 12.0–46.0)
Monocytes Relative: 12.5 % — ABNORMAL HIGH (ref 3.0–12.0)
Neutrophils Relative %: 57.3 % (ref 43.0–77.0)

## 2023-07-30 MED ORDER — PREDNISONE 20 MG PO TABS: ORAL_TABLET | ORAL | 0 refills | Status: DC

## 2023-08-01 LAB — RHEUMATOID FACTOR: Rheumatoid fact SerPl-aCnc: <10 [IU]/mL (ref <14)

## 2023-08-01 LAB — CYCLIC CITRUL PEPTIDE ANTIBODY, IGG: Cyclic Citrullin Peptide Ab: <16 U

## 2023-08-05 ENCOUNTER — Ambulatory Visit: Admitting: Primary Care

## 2023-08-06 ENCOUNTER — Encounter: Payer: Self-pay | Admitting: *Deleted

## 2023-08-10 MED ORDER — ONDANSETRON 4 MG PO TBDP: 4.0000 mg | ORAL_TABLET | Freq: Three times a day (TID) | ORAL | 0 refills | Status: DC | PRN

## 2023-08-10 MED ORDER — SCOPOLAMINE 1 MG/3DAYS TD PT72: 1.0000 | MEDICATED_PATCH | TRANSDERMAL | 0 refills | Status: DC

## 2023-08-13 ENCOUNTER — Other Ambulatory Visit (INDEPENDENT_AMBULATORY_CARE_PROVIDER_SITE_OTHER)

## 2023-08-13 ENCOUNTER — Ambulatory Visit (HOSPITAL_BASED_OUTPATIENT_CLINIC_OR_DEPARTMENT_OTHER)

## 2023-08-13 DIAGNOSIS — M6281 Muscle weakness (generalized): Secondary | ICD-10-CM

## 2023-08-13 DIAGNOSIS — R202 Paresthesia of skin: Secondary | ICD-10-CM | POA: Diagnosis not present

## 2023-08-25 ENCOUNTER — Ambulatory Visit: Payer: Self-pay | Admitting: Primary Care

## 2023-08-28 LAB — MUSK ANTIBODIES: MuSK Antibodies: <1 U/mL

## 2023-08-28 LAB — ACHR ABS WITH REFLEX TO MUSK: AChR Binding Ab, Serum: <0.07 nmol/L (ref 0.00–0.24)

## 2023-11-06 ENCOUNTER — Encounter: Admitting: Primary Care

## 2023-11-06 ENCOUNTER — Encounter: Payer: Self-pay | Admitting: Primary Care

## 2023-11-06 ENCOUNTER — Ambulatory Visit (INDEPENDENT_AMBULATORY_CARE_PROVIDER_SITE_OTHER): Admitting: Primary Care

## 2023-11-06 VITALS — BP 124/82 | HR 88 | Temp 97.0°F | Ht 70.0 in | Wt 305.0 lb

## 2023-11-06 DIAGNOSIS — I10 Essential (primary) hypertension: Secondary | ICD-10-CM

## 2023-11-06 DIAGNOSIS — G43009 Migraine without aura, not intractable, without status migrainosus: Secondary | ICD-10-CM | POA: Diagnosis not present

## 2023-11-06 DIAGNOSIS — E785 Hyperlipidemia, unspecified: Secondary | ICD-10-CM | POA: Diagnosis not present

## 2023-11-06 DIAGNOSIS — Z0000 Encounter for general adult medical examination without abnormal findings: Secondary | ICD-10-CM | POA: Diagnosis not present

## 2023-11-06 DIAGNOSIS — Z6841 Body Mass Index (BMI) 40.0 and over, adult: Secondary | ICD-10-CM

## 2023-11-06 DIAGNOSIS — R29898 Other symptoms and signs involving the musculoskeletal system: Secondary | ICD-10-CM

## 2023-11-06 DIAGNOSIS — G479 Sleep disorder, unspecified: Secondary | ICD-10-CM

## 2023-11-06 DIAGNOSIS — R202 Paresthesia of skin: Secondary | ICD-10-CM

## 2023-11-06 DIAGNOSIS — R7303 Prediabetes: Secondary | ICD-10-CM | POA: Diagnosis not present

## 2023-11-06 DIAGNOSIS — E66813 Obesity, class 3: Secondary | ICD-10-CM

## 2023-11-06 LAB — LIPID PANEL
Cholesterol: 221 mg/dL — ABNORMAL HIGH (ref 0–200)
HDL: 75 mg/dL (ref >39.00)
LDL Cholesterol: 126 mg/dL — ABNORMAL HIGH (ref 0–99)
NonHDL: 146.18
Total CHOL/HDL Ratio: 3
Triglycerides: 99 mg/dL (ref 0.0–149.0)
VLDL: 19.8 mg/dL (ref 0.0–40.0)

## 2023-11-06 LAB — HEMOGLOBIN A1C: Hgb A1c MFr Bld: 6.2 % (ref 4.6–6.5)

## 2023-11-06 NOTE — Assessment & Plan Note (Signed)
 Tetanus unclear, she will update  Pap smear UTD. Completes at GYN office Mammogram UTD, completes at GYN office Colonoscopy UTD, due 2026  Discussed the importance of a healthy diet and regular exercise in order for weight loss, and to reduce the risk of further co-morbidity.  Exam stable. Labs pending.  Follow up in 1 year for repeat physical.

## 2023-11-06 NOTE — Assessment & Plan Note (Signed)
 Commended her on weight loss.  Referral placed to [facility] for ongoing assistance.

## 2023-11-06 NOTE — Assessment & Plan Note (Signed)
 Overall controlled.  Continue Maxalt  5 mg PRN.

## 2023-11-06 NOTE — Assessment & Plan Note (Signed)
 Repeat A1C pending.

## 2023-11-06 NOTE — Assessment & Plan Note (Signed)
 Reviewed [facility] sleep study from 2024. Referral placed to pulmonology for further management.

## 2023-11-06 NOTE — Progress Notes (Signed)
 Subjective:    Patient ID: Michelle Thomas , female    DOB: [birth year], 48 y.o.   MRN: [id number]  HPI  Michelle Thomas  is a very pleasant 48 y.o. female who presents today for complete physical and follow up of chronic conditions.  Immunizations: -Tetanus: Unsure.  Diet: Fair diet.  Exercise: No regular exercise.  Eye exam: Completes annually  Dental exam: Completes semi-annually    Pap Smear: Completes per GYN. Mammogram: Completed in 2025  Colonoscopy: Completed in 2021, due 2026   BP Readings from Last 3 Encounters:  11/06/23 124/82  07/29/23 138/82  04/09/23 126/78      Review of Systems  Constitutional:  Negative for unexpected weight change.  HENT:  Negative for rhinorrhea.   Respiratory:  Negative for cough and shortness of breath.   Cardiovascular:  Negative for chest pain.  Gastrointestinal:  Negative for constipation and diarrhea.  Genitourinary:  Negative for difficulty urinating.  Musculoskeletal:  Positive for arthralgias.  Skin:  Negative for rash.  Allergic/Immunologic: Negative for environmental allergies.  Neurological:  Positive for weakness and numbness. Negative for dizziness and headaches.  Psychiatric/Behavioral:  The patient is not nervous/anxious.          Past Medical History:  Diagnosis Date   Hypertension    Migraine without aura, without mention of intractable migraine without mention of status migrainosus 06/18/2012   Obesity    Persistent cough for 3 weeks or longer 07/29/2023    Social History   Socioeconomic History   Marital status: Single    Spouse name: Not on file   Number of children: 0   Years of education: BA   Highest education level: Bachelor's degree (e.g., BA, AB, BS)  Occupational History   Occupation: Hydrologist: [employer]  Tobacco Use   Smoking status: Never   Smokeless tobacco: Never  Vaping Use   Vaping status: Never Used  Substance and Sexual  Activity   Alcohol use: Yes    Comment: occasionally   Drug use: No   Sexual activity: Yes    Birth control/protection: None    Comment: partner vasectomy  Other Topics Concern   Not on file  Social History Narrative   Not on file   Social Drivers of Health   Financial Resource Strain: Low Risk  (11/03/2023)   Overall Financial Resource Strain (CARDIA)    Difficulty of Paying Living Expenses: Not very hard  Food Insecurity: No Food Insecurity (11/03/2023)   Hunger Vital Sign    Worried About Running Out of Food in the Last Year: Never true    Ran Out of Food in the Last Year: Never true  Transportation Needs: No Transportation Needs (11/03/2023)   PRAPARE - Administrator, Civil Service (Medical): No    Lack of Transportation (Non-Medical): No  Physical Activity: Insufficiently Active (11/03/2023)   Exercise Vital Sign    Days of Exercise per Week: 2 days    Minutes of Exercise per Session: 30 min  Stress: Stress Concern Present (11/03/2023)   [employer] of Occupational Health - Occupational Stress Questionnaire    Feeling of Stress: To some extent  Social Connections: Moderately Integrated (11/03/2023)   Social Connection and Isolation Panel    Frequency of Communication with Friends and Family: More than three times a week    Frequency of Social Gatherings with Friends and Family: Twice a week    Attends Religious Services: 1 to 4 times  per year    Active Member of Clubs or Organizations: Yes    Attends Banker Meetings: Never    Marital Status: Never married  Intimate Partner Violence: Not on file    Past Surgical History:  Procedure Laterality Date   LAPAROSCOPY N/A 05/03/2018   Procedure: LAPAROSCOPY DIAGNOSTIC,;  Surgeon: Leva Rush, MD;  Location: [facility];  Service: Gynecology;  Laterality: N/A;   OVARIAN CYST REMOVAL Left 05/03/2018   Procedure: LEFT OVARIAN CYSTECTOMY;  Surgeon: Leva Rush, MD;  [facility];  Service: Gynecology;  Laterality: Left;   PLANTAR FASCIA RELEASE Left    WISDOM TOOTH EXTRACTION      Family History  Problem Relation Age of Onset   Hypertension Mother    Hypertension Father    Diabetes Father    Cancer Father        colon cancer   Headache Sister    Diabetes Sister    Breast cancer Cousin     Allergies  Allergen Reactions   Codeine     Rash   Penicillin V     Other reaction(s): unknown   Penicillins Cross Reactors Hives   Sumatriptan      Other reaction(s): burning sensation, sweating    Current Outpatient Medications on File Prior to Visit  Medication Sig Dispense Refill   amLODipine  (NORVASC ) 5 MG tablet Take 1 tablet (5 mg total) by mouth daily. for blood pressure. 90 tablet 3   aspirin-acetaminophen -caffeine  (EXCEDRIN MIGRAINE) 250-250-65 MG per tablet Take 1 tablet by mouth every 6 (six) hours as needed for pain.     ondansetron  (ZOFRAN -ODT) 4 MG disintegrating tablet Take 1 tablet (4 mg total) by mouth every 8 (eight) hours as needed for nausea or vomiting. 20 tablet 0   rizatriptan  (MAXALT ) 5 MG tablet Take 1 tablet by mouth at migraine onset. May repeat in 2 hours if needed 10 tablet 0   scopolamine  (TRANSDERM-SCOP) 1 MG/3DAYS Place 1 patch (1.5 mg total) onto the skin every 3 (three) days. 10 patch 0   traZODone  (DESYREL ) 50 MG tablet Take 50 mg by mouth at bedtime as needed.     No current facility-administered medications on file prior to visit.    BP 124/82   Pulse 88   Temp (!) 97 F (36.1 C) (Temporal)   Ht 5' 10 (1.778 m)   Wt (!) 305 lb (138.3 kg)   LMP 11/02/2023   SpO2 96%   BMI 43.76 kg/m  Objective:   Physical Exam HENT:     Right Ear: Tympanic membrane and ear canal normal.     Left Ear: Tympanic membrane and ear canal normal.  Eyes:     Pupils: Pupils are equal, round, and reactive to light.  Cardiovascular:     Rate and Rhythm: Normal rate and regular rhythm.  Pulmonary:     Effort: Pulmonary  effort is normal.     Breath sounds: Normal breath sounds.  Abdominal:     General: Bowel sounds are normal.     Palpations: Abdomen is soft.     Tenderness: There is no abdominal tenderness.  Musculoskeletal:        General: Normal range of motion.     Cervical back: Neck supple.  Skin:    General: Skin is warm and dry.  Neurological:     Mental Status: She is alert and oriented to person, place, and time.     Cranial Nerves: No cranial nerve deficit.  Deep Tendon Reflexes:     Reflex Scores:      Patellar reflexes are 2+ on the right side and 2+ on the left side. Psychiatric:        Mood and Affect: Mood normal.           Assessment & Plan:  Preventative health care Assessment & Plan: Tetanus unclear, she will update  Pap smear UTD. Completes at GYN office Mammogram UTD, completes at GYN office Colonoscopy UTD, due 2026  Discussed the importance of a healthy diet and regular exercise in order for weight loss, and to reduce the risk of further co-morbidity.  Exam stable. Labs pending.  Follow up in 1 year for repeat physical.    Migraine without aura and without status migrainosus, not intractable Assessment & Plan: Overall controlled.  Continue Maxalt  5 mg PRN.    Essential hypertension Assessment & Plan: Controlled.  Continue amlodipine  5 mg daily.  Orders: -     Amb Ref to Medical Weight Management  Prediabetes Assessment & Plan: Repeat A1C pending.  Orders: -     Hemoglobin A1c -     Amb Ref to Medical Weight Management  Sleep disturbance Assessment & Plan: Reviewed [facility] sleep study from 2024. Referral placed to pulmonology for further management.   Orders: -     Pulmonary Visit  Upper extremity weakness Assessment & Plan: Ongoing. Never completed MRI.  Referral placed to neurology to rule out neuromuscular disorder.   Orders: -     Ambulatory referral to Neurology  Paresthesias -     Ambulatory referral to  Neurology  Hyperlipidemia, unspecified hyperlipidemia type -     Lipid panel -     Amb Ref to Medical Weight Management  Class 3 severe obesity due to excess calories with body mass index (BMI) of 40.0 to 44.9 in adult Assessment & Plan: Commended her on weight loss.  Referral placed to healthy weight and wellness center for ongoing assistance.   Orders: -     Amb Ref to Medical Weight Management        Comer MARLA Gaskins, NP

## 2023-11-06 NOTE — Assessment & Plan Note (Signed)
 Controlled. Continue amlodipine 5 mg daily.

## 2023-11-06 NOTE — Assessment & Plan Note (Signed)
 Ongoing. Never completed MRI.  Referral placed to neurology to rule out neuromuscular disorder.

## 2023-11-06 NOTE — Patient Instructions (Signed)
.  Stop by the lab prior to leaving today. I will notify you of your results once received.   You will either be contacted via phone regarding your referral to neurology, pulmonology, and healthy weight and wellness, or you may receive a letter on your MyChart portal from our referral team with instructions for scheduling an appointment. Please let us  know if you have not been contacted by anyone within two weeks.  It was a pleasure to see you today!

## 2023-11-08 ENCOUNTER — Ambulatory Visit: Payer: Self-pay | Admitting: Primary Care

## 2023-11-10 ENCOUNTER — Ambulatory Visit: Admitting: Plastic Surgery

## 2023-11-10 VITALS — BP 142/84 | HR 82 | Ht 69.0 in | Wt 305.2 lb

## 2023-11-10 DIAGNOSIS — M542 Cervicalgia: Secondary | ICD-10-CM | POA: Diagnosis not present

## 2023-11-10 DIAGNOSIS — Z6841 Body Mass Index (BMI) 40.0 and over, adult: Secondary | ICD-10-CM | POA: Diagnosis not present

## 2023-11-10 DIAGNOSIS — M5441 Lumbago with sciatica, right side: Secondary | ICD-10-CM | POA: Insufficient documentation

## 2023-11-10 DIAGNOSIS — Z803 Family history of malignant neoplasm of breast: Secondary | ICD-10-CM

## 2023-11-10 DIAGNOSIS — M549 Dorsalgia, unspecified: Secondary | ICD-10-CM | POA: Insufficient documentation

## 2023-11-10 DIAGNOSIS — N62 Hypertrophy of breast: Secondary | ICD-10-CM

## 2023-11-10 DIAGNOSIS — G8929 Other chronic pain: Secondary | ICD-10-CM

## 2023-11-10 DIAGNOSIS — M546 Pain in thoracic spine: Secondary | ICD-10-CM | POA: Diagnosis not present

## 2023-11-10 NOTE — Progress Notes (Signed)
 Patient ID: Michelle Thomas , female    DOB: [birth year], 48 y.o.   MRN: [id number]   Chief Complaint  Patient presents with   Advice Only    Mammary Hyperplasia: The patient is a 48 y.o. female with a history of mammary hyperplasia for several years.  She has extremely large breasts causing symptoms that include the following: Back pain in the upper and lower back, including neck pain. She pulls or pins her bra straps to provide better lift and relief of the pressure and pain. She notices relief by holding her breast up manually.  Her shoulder straps cause grooves and pain and pressure that requires padding for relief. Pain medication is sometimes required with motrin  and tylenol .  Activities that are hindered by enlarged breasts include: exercise and running.  She has tried supportive clothing as well as fitted bras without improvement.  Her breasts are extremely large and fairly symmetric but the right is slightly longer.  She has hyperpigmentation of the inframammary area on both sides.  The sternal to nipple distance on the right is 38 cm and the left is 37 cm.  The IMF distance is 20 cm.  She is 5 feet 9 inches tall and weighs 305 pounds.  The BMI = 45 kg/m.  Preoperative bra size = 46 DDD cup.  The estimated excess breast tissue to be removed at the time of surgery is 1000 g for each side but according to her calculations she might need 1900 g in order to be approved by surgery.  Mammogram history: July 2025 and negative.  Family history of breast cancer: Cousin.  Tobacco use: None.   The patient expresses the desire to pursue surgical intervention.  She has had her wisdom teeth removed without difficulty no other surgery.     Review of Systems  Constitutional:  Positive for activity change. Negative for appetite change.  Eyes: Negative.   Respiratory: Negative.  Negative for chest tightness.   Cardiovascular: Negative.   Gastrointestinal: Negative.   Endocrine: Negative.    Genitourinary: Negative.   Musculoskeletal:  Positive for back pain and neck pain.  Skin:  Positive for rash.    Past Medical History:  Diagnosis Date   Hypertension    Migraine without aura, without mention of intractable migraine without mention of status migrainosus 06/18/2012   Obesity    Persistent cough for 3 weeks or longer 07/29/2023    Past Surgical History:  Procedure Laterality Date   LAPAROSCOPY N/A 05/03/2018   Procedure: LAPAROSCOPY DIAGNOSTIC,;  Surgeon: Leva Rush, MD;  Location: [facility];  Service: Gynecology;  Laterality: N/A;   OVARIAN CYST REMOVAL Left 05/03/2018   Procedure: LEFT OVARIAN CYSTECTOMY;  Surgeon: Leva Rush, MD;  Location: [facility];  Service: Gynecology;  Laterality: Left;   PLANTAR FASCIA RELEASE Left    WISDOM TOOTH EXTRACTION        Current Outpatient Medications:    amLODipine  (NORVASC ) 5 MG tablet, Take 1 tablet (5 mg total) by mouth daily. for blood pressure., Disp: 90 tablet, Rfl: 3   aspirin-acetaminophen -caffeine  (EXCEDRIN MIGRAINE) 250-250-65 MG per tablet, Take 1 tablet by mouth every 6 (six) hours as needed for pain., Disp: , Rfl:    ondansetron  (ZOFRAN -ODT) 4 MG disintegrating tablet, Take 1 tablet (4 mg total) by mouth every 8 (eight) hours as needed for nausea or vomiting., Disp: 20 tablet, Rfl: 0   rizatriptan  (MAXALT ) 5 MG tablet, Take 1 tablet by mouth at migraine  onset. May repeat in 2 hours if needed, Disp: 10 tablet, Rfl: 0   scopolamine  (TRANSDERM-SCOP) 1 MG/3DAYS, Place 1 patch (1.5 mg total) onto the skin every 3 (three) days., Disp: 10 patch, Rfl: 0   traZODone  (DESYREL ) 50 MG tablet, Take 50 mg by mouth at bedtime as needed., Disp: , Rfl:    Objective:   Vitals:   11/10/23 0821  BP: (!) 142/84  Pulse: 82  SpO2: 98%    Physical Exam Vitals reviewed.  Constitutional:      Appearance: Normal appearance.  HENT:     Head: Atraumatic.  Cardiovascular:     Rate and Rhythm:  Normal rate.     Pulses: Normal pulses.  Pulmonary:     Effort: Pulmonary effort is normal.  Abdominal:     General: There is no distension.     Palpations: Abdomen is soft.     Tenderness: There is no abdominal tenderness.  Skin:    General: Skin is warm.     Capillary Refill: Capillary refill takes less than 2 seconds.  Neurological:     Mental Status: She is alert and oriented to person, place, and time.  Psychiatric:        Mood and Affect: Mood normal.        Behavior: Behavior normal.        Thought Content: Thought content normal.        Judgment: Judgment normal.     Assessment & Plan:  Symptomatic mammary hypertrophy  Chronic bilateral thoracic back pain  Neck pain  The procedure the patient selected and that was best for the patient was discussed. The risk were discussed and include but not limited to the following:  Breast asymmetry, fluid accumulation, firmness of the breast, inability to breast feed, loss of nipple or areola, skin loss, change in skin and nipple sensation, fat necrosis of the breast tissue, bleeding, infection and healing delay.  There are risks of anesthesia and injury to nerves or blood vessels.  Allergic reaction to tape, suture and skin glue are possible.  There will be swelling.  Any of these can lead to the need for revisional surgery which is not included in this surgery.  A breast reduction has potential to interfere with diagnostic procedures in the future.  This procedure is best done when the breast is fully developed.  Changes in the breast will continue to occur over time: pregnancy, weight gain or weigh loss. No guarantees are given for a certain bra or breast size.    Total time: 40 minutes. This includes time spent with the patient during the visit as well as time spent before and after the visit reviewing the chart, documenting the encounter, ordering pertinent studies and literature for the patient.   Physical therapy: Not  required Mammogram: Done 2025 Healthy Weight and Wellness: Has a referral and is planning on seeing them.  We will have her come and see us  in 3 months.  I think she is really good and need to lose 20 -50 pounds in order to be eligible for the surgery.  Otherwise she is a good candidate for bilateral breast reduction and she may want to pay for extra lateral liposuction.  Pictures were obtained of the patient and placed in the chart with the patient's or guardian's permission.   Michelle RAMAN Persephanie Laatsch, DO

## 2023-11-18 ENCOUNTER — Ambulatory Visit: Admitting: Sleep Medicine

## 2023-11-19 ENCOUNTER — Encounter (INDEPENDENT_AMBULATORY_CARE_PROVIDER_SITE_OTHER): Payer: Self-pay

## 2024-02-03 ENCOUNTER — Ambulatory Visit: Admitting: Primary Care

## 2024-02-03 ENCOUNTER — Ambulatory Visit (INDEPENDENT_AMBULATORY_CARE_PROVIDER_SITE_OTHER)
Admission: RE | Admit: 2024-02-03 | Discharge: 2024-02-03 | Disposition: A | Discharge status: Home / Self Care | Source: Ambulatory Visit | Attending: Primary Care | Admitting: Primary Care

## 2024-02-03 ENCOUNTER — Encounter: Payer: Self-pay | Admitting: Primary Care

## 2024-02-03 VITALS — BP 134/82 | HR 82 | Temp 98.3°F | Ht 70.0 in | Wt 310.2 lb

## 2024-02-03 DIAGNOSIS — M545 Low back pain, unspecified: Secondary | ICD-10-CM | POA: Diagnosis not present

## 2024-02-03 DIAGNOSIS — M5441 Lumbago with sciatica, right side: Secondary | ICD-10-CM

## 2024-02-03 LAB — POC URINALSYSI DIPSTICK (AUTOMATED)
Bilirubin, UA: NEGATIVE
Blood, UA: NEGATIVE
Glucose, UA: NEGATIVE
Ketones, UA: NEGATIVE
Leukocytes, UA: NEGATIVE
Nitrite, UA: NEGATIVE
Protein, UA: NEGATIVE
Spec Grav, UA: 1.025 (ref 1.010–1.025)
Urobilinogen, UA: 0.2 U/dL
pH, UA: 5.5 (ref 5.0–8.0)

## 2024-02-03 MED ORDER — KETOROLAC TROMETHAMINE 60 MG/2ML IM SOLN
60.0000 mg | Freq: Once | INTRAMUSCULAR | Status: AC
  Administered 2024-02-03: 60 mg via INTRAMUSCULAR

## 2024-02-03 MED ORDER — PREDNISONE 20 MG PO TABS: ORAL_TABLET | ORAL | 0 refills | Status: DC

## 2024-02-03 NOTE — Progress Notes (Signed)
 Subjective:    Patient ID: Michelle Thomas , female    DOB: [birth year], 48 y.o.   MRN: [id number]  Michelle Thomas  is a very pleasant 48 y.o. female with a history of back pain, neck pain, paresthesias, obesity who presents today to discuss back pain.  Her pain is located to the right lower lumbar region that began 4 days ago. Her pain began suddenly since taking off a tight article of clothing. She has intermittent right lower extremity pain down to her knee. She has difficulty falling asleep due to the pain. She's also experiencing pain with walking, standing, sitting. She has to change positions often.   She's been sleeping with a sleeping pad. She denies injury, trauma, loss of bowel/bladder control. She's been taking muscle relaxer medication from family and a friend without improvement. She's also taken Bayer Back and Body, and 1/4 of a Percocet.   She has a prior history of back pain, has not completed an xray.    Review of Systems  Musculoskeletal:  Positive for back pain.  Neurological:  Negative for weakness and numbness.         Past Medical History:  Diagnosis Date   Hypertension    Migraine without aura, without mention of intractable migraine without mention of status migrainosus 06/18/2012   Obesity    Persistent cough for 3 weeks or longer 07/29/2023    Social History   Socioeconomic History   Marital status: Single    Spouse name: Not on file   Number of children: 0   Years of education: BA   Highest education level: Bachelor's degree (e.g., BA, AB, BS)  Occupational History   Occupation: Hydrologist: [employer]  Tobacco Use   Smoking status: Never   Smokeless tobacco: Never  Vaping Use   Vaping status: Never Used  Substance and Sexual Activity   Alcohol use: Yes    Comment: occasionally   Drug use: No   Sexual activity: Yes    Birth control/protection: None    Comment: partner vasectomy  Other  Topics Concern   Not on file  Social History Narrative   Not on file   Social Drivers of Health   Financial Resource Strain: Low Risk  (11/03/2023)   Overall Financial Resource Strain (CARDIA)    Difficulty of Paying Living Expenses: Not very hard  Food Insecurity: No Food Insecurity (11/03/2023)   Hunger Vital Sign    Worried About Running Out of Food in the Last Year: Never true    Ran Out of Food in the Last Year: Never true  Transportation Needs: No Transportation Needs (11/03/2023)   PRAPARE - Administrator, Civil Service (Medical): No    Lack of Transportation (Non-Medical): No  Physical Activity: Insufficiently Active (11/03/2023)   Exercise Vital Sign    Days of Exercise per Week: 2 days    Minutes of Exercise per Session: 30 min  Stress: Stress Concern Present (11/03/2023)   [employer] of Occupational Health - Occupational Stress Questionnaire    Feeling of Stress: To some extent  Social Connections: Moderately Integrated (11/03/2023)   Social Connection and Isolation Panel    Frequency of Communication with Friends and Family: More than three times a week    Frequency of Social Gatherings with Friends and Family: Twice a week    Attends Religious Services: 1 to 4 times per year    Active Member of [employer] or Organizations: Yes  Attends Banker Meetings: Never    Marital Status: Never married  Intimate Partner Violence: Not on file    Past Surgical History:  Procedure Laterality Date   LAPAROSCOPY N/A 05/03/2018   Procedure: LAPAROSCOPY DIAGNOSTIC,;  Surgeon: Leva Rush, MD;  Location: [facility];  Service: Gynecology;  Laterality: N/A;   OVARIAN CYST REMOVAL Left 05/03/2018   Procedure: LEFT OVARIAN CYSTECTOMY;  Surgeon: Leva Rush, MD;  Location: [facility] ;  Service: Gynecology;  Laterality: Left;   PLANTAR FASCIA RELEASE Left    WISDOM TOOTH EXTRACTION      Family History  Problem Relation Age of Onset    Hypertension Mother    Hypertension Father    Diabetes Father    Cancer Father        colon cancer   Headache Sister    Diabetes Sister    Breast cancer Cousin     Allergies  Allergen Reactions   Codeine     Rash   Penicillin V     Other reaction(s): unknown   Penicillins Cross Reactors Hives   Sumatriptan      Other reaction(s): burning sensation, sweating    Current Outpatient Medications on File Prior to Visit  Medication Sig Dispense Refill   amLODipine  (NORVASC ) 5 MG tablet Take 1 tablet (5 mg total) by mouth daily. for blood pressure. 90 tablet 3   aspirin-acetaminophen -caffeine  (EXCEDRIN MIGRAINE) 250-250-65 MG per tablet Take 1 tablet by mouth every 6 (six) hours as needed for pain.     ondansetron  (ZOFRAN -ODT) 4 MG disintegrating tablet Take 1 tablet (4 mg total) by mouth every 8 (eight) hours as needed for nausea or vomiting. 20 tablet 0   rizatriptan  (MAXALT ) 5 MG tablet Take 1 tablet by mouth at migraine onset. May repeat in 2 hours if needed 10 tablet 0   scopolamine  (TRANSDERM-SCOP) 1 MG/3DAYS Place 1 patch (1.5 mg total) onto the skin every 3 (three) days. 10 patch 0   traZODone  (DESYREL ) 50 MG tablet Take 50 mg by mouth at bedtime as needed.     No current facility-administered medications on file prior to visit.    BP 134/82   Pulse 82   Temp 98.3 F (36.8 C) (Oral)   Ht 5' 10 (1.778 m)   Wt (!) 310 lb 4 oz (140.7 kg)   LMP 01/19/2024   SpO2 94%   BMI 44.52 kg/m  Objective:   Physical Exam Cardiovascular:     Rate and Rhythm: Normal rate.  Pulmonary:     Effort: Pulmonary effort is normal.  Musculoskeletal:     Lumbar back: Tenderness present. Decreased range of motion. Negative right straight leg raise test and negative left straight leg raise test.       Back:     Comments: Decrease in range of motion due to pain in most planes of motion.  Skin:    General: Skin is warm and dry.  Neurological:     Mental Status: She is alert.      Physical Exam        Assessment & Plan:  Acute right-sided low back pain with right-sided sciatica Assessment & Plan: HPI and exam today representative of musculoskeletal etiology. Fortunately, no alarm signs.  Urinalysis negative today. Toradol  60 mg provided intramuscularly today. Start prednisone  tablets. 60 mg x 3 days, 40 mg x 3 days, 20 mg x 3 days. Discussed to avoid NSAIDs.  Will obtain plain films of the lumbar spine today given  history of back pain.  Stretching, heat, ice, walking.  Orders: -     DG Lumbar Spine Complete -     predniSONE ; Take 3 tablets by mouth daily x 3 days, then 2 tablets x 3 days, then 1 tablet for 3 days.  Dispense: 18 tablet; Refill: 0  Acute right-sided low back pain, unspecified whether sciatica present Assessment & Plan: HPI and exam today representative of musculoskeletal etiology. Fortunately, no alarm signs.  Urinalysis negative today. Toradol  60 mg provided intramuscularly today. Start prednisone  tablets. 60 mg x 3 days, 40 mg x 3 days, 20 mg x 3 days. Discussed to avoid NSAIDs.  Will obtain plain films of the lumbar spine today given history of back pain.  Stretching, heat, ice, walking.  Orders: -     POCT Urinalysis Dipstick (Automated)    Assessment and Plan Assessment & Plan         Comer MARLA Gaskins, NP     History of Present Illness

## 2024-02-03 NOTE — Assessment & Plan Note (Signed)
 HPI and exam today representative of musculoskeletal etiology. Fortunately, no alarm signs.  Urinalysis negative today. Toradol  60 mg provided intramuscularly today. Start prednisone  tablets. 60 mg x 3 days, 40 mg x 3 days, 20 mg x 3 days. Discussed to avoid NSAIDs.  Will obtain plain films of the lumbar spine today given history of back pain.  Stretching, heat, ice, walking.

## 2024-02-03 NOTE — Patient Instructions (Signed)
 Start prednisone  tablets for back pain take 3 tablets by mouth daily x 3 days, then 2 tablets x 3 days, then 1 tablet for 3 days.  Complete xray(s) prior to leaving today. I will notify you of your results once received.  Avoid ibuprofen /Advil /Motrin  while taking prednisone .  It was a pleasure to see you today!

## 2024-02-03 NOTE — Addendum Note (Signed)
 Addended by: Jarry Manon on: 02/03/2024 09:09 AM   Modules accepted: Orders

## 2024-02-05 ENCOUNTER — Ambulatory Visit: Payer: Self-pay | Admitting: Primary Care

## 2024-02-10 ENCOUNTER — Ambulatory Visit: Admitting: Student

## 2024-02-18 ENCOUNTER — Ambulatory Visit: Admitting: Student

## 2024-03-04 ENCOUNTER — Ambulatory Visit: Admitting: Primary Care

## 2024-03-04 VITALS — BP 116/76 | HR 62 | Temp 97.9°F | Ht 70.0 in | Wt 308.0 lb

## 2024-03-04 DIAGNOSIS — M7121 Synovial cyst of popliteal space [Baker], right knee: Secondary | ICD-10-CM | POA: Insufficient documentation

## 2024-03-04 DIAGNOSIS — M79601 Pain in right arm: Secondary | ICD-10-CM | POA: Insufficient documentation

## 2024-03-04 MED ORDER — PREDNISONE 20 MG PO TABS: ORAL_TABLET | ORAL | 0 refills | Status: DC

## 2024-03-04 NOTE — Progress Notes (Signed)
 Subjective:    Patient ID: Michelle Thomas , female    DOB: 12/05/1975, 48 y.o.   MRN: [id number]  Michelle Thomas  is a very pleasant 48 y.o. female with a history of hypertension, obesity, paresthesias, upper extremity weakness, neck pain who presents today to discuss normal right posterior knee swelling and right upper extremity pain.  Her pain is located to right humeral region with radiation to the wrist for which is intermittent. She describes her pain as a toothache. Symptoms have been more noticeable for the last 2 weeks, but symptoms began earlier than then. 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 She denies injury/trauma, strenuous activity, numbness/tingling. Her symptoms improve during her days off from work.  About 1 week ago she began noticing an altered gait with knee pain and swelling to the right popliteal fossa. She cannot fully flex her right lower extremity due to tightness and discomfort. She does have stiffness to her knees when waking in the morning or when standing after sitting for prolonged periods of time.  Overall, she denies any pain.   Review of Systems  Musculoskeletal:  Positive for arthralgias.  Skin:  Negative for color change.  Neurological:  Negative for numbness.         Past Medical History:  Diagnosis Date   Hypertension    Migraine without aura, without mention of intractable migraine without mention of status migrainosus 06/18/2012   Obesity    Persistent cough for 3 weeks or longer 07/29/2023    Social History   Socioeconomic History   Marital status: Single    Spouse name: Not on file   Number of children: 0   Years of education: BA   Highest education level: Bachelor's degree (e.g., BA, AB, BS)  Occupational History   Occupation: Hydrologist: [employer]  Tobacco Use   Smoking status: Never   Smokeless tobacco: Never   Vaping Use   Vaping status: Never Used  Substance and Sexual Activity   Alcohol use: Yes    Comment: occasionally   Drug use: No   Sexual activity: Yes    Birth control/protection: None    Comment: partner vasectomy  Other Topics Concern   Not on file  Social History Narrative   Not on file   Social Drivers of Health   Financial Resource Strain: Low Risk  (03/04/2024)   Overall Financial Resource Strain (CARDIA)    Difficulty of Paying Living Expenses: Not very hard  Food Insecurity: No Food Insecurity (03/04/2024)   Hunger Vital Sign    Worried About Running Out of Food in the Last Year: Never true    Ran Out of Food in the Last Year: Never true  Transportation Needs: No Transportation Needs (03/04/2024)   PRAPARE - Administrator, Civil Service (Medical): No    Lack of Transportation (Non-Medical): No  Physical Activity: Insufficiently Active (03/04/2024)   Exercise Vital Sign    Days of Exercise per Week: 2 days    Minutes of Exercise per Session: 30 min  Stress: Stress Concern Present (03/04/2024)   [employer] of Occupational Health - Occupational Stress Questionnaire    Feeling of Stress: To some extent  Social Connections: Moderately Isolated (03/04/2024)   Social Connection and Isolation Panel    Frequency of Communication with Friends and Family: More than three times a week    Frequency of Social  Gatherings with Friends and Family: Twice a week    Attends Religious Services: Never    Database Administrator or Organizations: Yes    Attends Banker Meetings: Never    Marital Status: Never married  Intimate Partner Violence: Not on file    Past Surgical History:  Procedure Laterality Date   LAPAROSCOPY N/A 05/03/2018   Procedure: LAPAROSCOPY DIAGNOSTIC,;  Surgeon: Leva Rush, MD;  Location: [facility];  Service: Gynecology;  Laterality: N/A;   OVARIAN CYST REMOVAL Left 05/03/2018   Procedure: LEFT OVARIAN CYSTECTOMY;   Surgeon: Leva Rush, MD;  Location: [facility] [place];  Service: Gynecology;  Laterality: Left;   PLANTAR FASCIA RELEASE Left    WISDOM TOOTH EXTRACTION      Family History  Problem Relation Age of Onset   Hypertension Mother    Hypertension Father    Diabetes Father    Cancer Father        colon cancer   Headache Sister    Diabetes Sister    Breast cancer Cousin     Allergies  Allergen Reactions   Codeine     Rash   Penicillin V     Other reaction(s): unknown   Penicillins Cross Reactors Hives   Sumatriptan      Other reaction(s): burning sensation, sweating    Current Outpatient Medications on File Prior to Visit  Medication Sig Dispense Refill   amLODipine  (NORVASC ) 5 MG tablet Take 1 tablet (5 mg total) by mouth daily. for blood pressure. 90 tablet 3   aspirin-acetaminophen -caffeine  (EXCEDRIN MIGRAINE) 250-250-65 MG per tablet Take 1 tablet by mouth every 6 (six) hours as needed for pain.     ondansetron  (ZOFRAN -ODT) 4 MG disintegrating tablet Take 1 tablet (4 mg total) by mouth every 8 (eight) hours as needed for nausea or vomiting. 20 tablet 0   rizatriptan  (MAXALT ) 5 MG tablet Take 1 tablet by mouth at migraine onset. May repeat in 2 hours if needed 10 tablet 0   scopolamine  (TRANSDERM-SCOP) 1 MG/3DAYS Place 1 patch (1.5 mg total) onto the skin every 3 (three) days. 10 patch 0   traZODone  (DESYREL ) 50 MG tablet Take 50 mg by mouth at bedtime as needed.     No current facility-administered medications on file prior to visit.    BP 116/76   Pulse 62   Temp 97.9 F (36.6 C) (Temporal)   Ht 5' 10 (1.778 m)   Wt (!) 308 lb (139.7 kg)   LMP 01/19/2024 Comment: Pt denies chance of pregnancy  SpO2 99%   BMI 44.19 kg/m  Objective:   Physical Exam Cardiovascular:     Rate and Rhythm: Normal rate and regular rhythm.  Pulmonary:     Effort: Pulmonary effort is normal.     Breath sounds: Normal breath sounds.  Musculoskeletal:     Right upper arm:  Normal.     Left upper arm: Normal.     Right knee: No erythema. Decreased range of motion.     Left knee: No erythema. Normal range of motion.       Legs:     Comments: Mild to moderate swelling to right popliteal fossa  Skin:    General: Skin is warm and dry.     Physical Exam        Assessment & Plan:  Diffuse pain in right upper extremity Assessment & Plan: Differentials include tendonitis from overuse. Could be early nerve involvement.   Recommended rest as  the ultimate treatment, but she must continue to work. She will work with her employer to improve her ergonomics during the day.  Start prednisone  20 mg tablets. Take 3 tablets by mouth every morning for 3 days, then 2 tablets for 3 days, then 1 tablet for 3 days. Once complete, okay to start Naproxen  500 mg BID with food.  Consider sports medicine evaluation if no improvement.   Orders: -     predniSONE ; Take 3 tablets by mouth daily x 3 days, then 2 tablets x 3 days, then 1 tablet for 3 days.  Dispense: 18 tablet; Refill: 0  Baker's cyst of knee, right Assessment & Plan: Evident on exam.  Start prednisone  20 mg tablets. Take 3 tablets by mouth every morning for 3 days, then 2 tablets for 3 days, then 1 tablet for 3 days.  Start naproxen  500 mg BID with meals once complete with prednisone .   Consider sports medicine evaluation if no resolve.  She kindly declines xray     Assessment and Plan Assessment & Plan         Comer MARLA Gaskins, NP

## 2024-03-04 NOTE — Assessment & Plan Note (Addendum)
 Evident on exam.  Start prednisone  20 mg tablets. Take 3 tablets by mouth every morning for 3 days, then 2 tablets for 3 days, then 1 tablet for 3 days.  Start naproxen  500 mg BID with meals once complete with prednisone .   Consider sports medicine evaluation if no resolve.  She kindly declines xray

## 2024-03-04 NOTE — Patient Instructions (Signed)
 Start prednisone  for arm pain and knee swelling.  Take 3 tablets by mouth daily x 3 days, then 2 tablets x 3 days, then 1 tablet for 3 days.   When finished with the prednisone , you can try naproxen  1 to 2 tablets twice daily with food for 2 weeks.  Consider seeing our sports medicine doctor, Dr. Ubaldo if no improvement.  It was a pleasure to see you today!

## 2024-03-04 NOTE — Assessment & Plan Note (Signed)
 Differentials include tendonitis from overuse. Could be early nerve involvement.   Recommended rest as the ultimate treatment, but she must continue to work. She will work with her employer to improve her ergonomics during the day.  Start prednisone  20 mg tablets. Take 3 tablets by mouth every morning for 3 days, then 2 tablets for 3 days, then 1 tablet for 3 days. Once complete, okay to start Naproxen  500 mg BID with food.  Consider sports medicine evaluation if no improvement.

## 2024-03-07 ENCOUNTER — Other Ambulatory Visit: Payer: Self-pay

## 2024-03-07 DIAGNOSIS — I10 Essential (primary) hypertension: Secondary | ICD-10-CM

## 2024-03-07 MED ORDER — AMLODIPINE BESYLATE 5 MG PO TABS: 5.0000 mg | ORAL_TABLET | Freq: Every day | ORAL | 1 refills | Status: AC

## 2024-04-05 DIAGNOSIS — Z87898 Personal history of other specified conditions: Secondary | ICD-10-CM

## 2024-04-05 MED ORDER — ONDANSETRON 4 MG PO TBDP: 4.0000 mg | ORAL_TABLET | Freq: Three times a day (TID) | ORAL | 0 refills | Status: AC | PRN

## 2024-04-05 MED ORDER — SCOPOLAMINE 1 MG/3DAYS TD PT72: 1.0000 | MEDICATED_PATCH | TRANSDERMAL | 0 refills | Status: AC

## 2024-04-06 NOTE — Progress Notes (Signed)
 "    Michelle Chauca T. Camren Lipsett, MD, CAQ Sports Medicine [facility] at [facility] [address] [place], [address]  Phone: [phone]  FAX: [phone]  Michelle Thomas  - 49 y.o. female  MRN [id number]  Date of Birth: 1975-12-13  Date: 04/07/2024  PCP: Michelle Comer POUR, NP  Referral: Michelle Comer POUR, NP  Chief Complaint  Patient presents with   Baker's Cyst    Right Knee   Subjective:   Michelle Thomas  is a 49 y.o. very pleasant female patient with Body mass index is 44.77 kg/m. who presents with the following:  Discussed the use of AI scribe software for clinical note transcription with the patient, who gave verbal consent to proceed.  Patient is here for evaluation of what sounds as if she has a Baker's cyst on the right. History of Present Illness Michelle A Rieser  Thomas is a 49 year old female who presents with right knee pain and stiffness.  The right knee pain began approximately one month ago. Initially, she saw PCP Michelle Thomas, who diagnosed a Baker's cyst and prescribed a course of steroids. The steroids provided relief, but the pain returned after completing the medication.  The pain is localized to the back of the right knee and is exacerbated by bending, particularly at night and in the morning when it is stiff. She describes difficulty in fully bending the knee and notes that it 'doesn't want to pop' initially, but eventually does, providing some relief.  She also has some medial pain.  She denies any history of old injuries, broken bones, or surgeries related to the knee. 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  No direct heavy labor.  She denies any issues with the left knee, stating it is fine.    Review of Systems is noted in the HPI, as appropriate  Objective:   BP 130/84   Pulse 83   Temp 98.8 F (37.1 C)  (Temporal)   Ht 5' 10 (1.778 m)   Wt (!) 312 lb (141.5 kg)   LMP 03/15/2024   SpO2 99%   BMI 44.77 kg/m   GEN: No acute distress; alert,appropriate. PULM: Breathing comfortably in no respiratory distress PSYCH: Normally interactive.   Right knee: Full extension and flexion to 120 Mild effusion Poor patellar motion and pain with loading the patellar facets Significant medial and posteromedial joint line tenderness There is some popliteal fullness and cyst Stable to varus and valgus stress.  ACL and PCL are intact Flexion pinch and McMurray's do induce some pain  Laboratory and Imaging Data:  Assessment and Plan:     ICD-10-CM   1. Chronic pain of right knee  M25.561 DG Knee 4 Views W/Patella Right   G89.29 triamcinolone  acetonide (KENALOG -40) injection 40 mg    2. Baker's cyst of knee, right  M71.21 DG Knee 4 Views W/Patella Right     X-rays: AP Bilateral Weight-bearing, Weightbearing Lateral, Sunrise views, Michelle Thomas view Indication: knee pain Findings: Joint spaces are fairly well-preserved.  No acute injury including no fracture or dislocation. Electronically Signed  By: Jacques Schroeder, MD On: 04/07/2024  2:20 PM EST  Assessment & Plan Right knee osteoarthritis with Baker's cyst Chronic right knee pain with Baker's cyst, which is a secondary product to internal derangement. Limited flexion and tenderness noted.   - Ordered right knee x-ray, which shows minimal degenerative change.  Differential diagnosis includes meniscal tear  versus other internal derangement. - Discussed potential underlying causes of knee pain and inflammation. -Work on weight loss - We will do an intra-articular knee injection today to try to calm down her knee - Celebrex  scheduled for the next 2 to 3 weeks  Aspiration/Injection Procedure Note Michelle Thomas  16-Sep-1975 Date of procedure: 04/07/2024  Procedure: Large Joint Aspiration / Injection of Knee, R Indications:  Pain  Procedure Details Patient verbally consented to procedure. Risks, benefits, and alternatives explained. Sterilely prepped with Chloraprep. Ethyl cholride used for anesthesia. 9 cc Lidocaine  1% mixed with 1 mL of Kenalog  40 mg injected using the anteromedial approach without difficulty. No complications with procedure and tolerated well. Patient had decreased pain post-injection. Medication: 1 mL of Kenalog  40 mg    Medication Management during today's office visit: Meds ordered this encounter  Medications   celecoxib  (CELEBREX ) 200 MG capsule    Sig: Take 1 capsule (200 mg total) by mouth daily.    Dispense:  30 capsule    Refill:  2   triamcinolone  acetonide (KENALOG -40) injection 40 mg   Medications Discontinued During This Encounter  Medication Reason   predniSONE  (DELTASONE ) 20 MG tablet Completed Course    Orders placed today for conditions managed today: Orders Placed This Encounter  Procedures   DG Knee 4 Views W/Patella Right    Disposition: No follow-ups on file.  [employer] speech-to-text software was used for transcription in this dictation.  Possible transcriptional errors can occur using Animal nutritionist.   Signed,  Jacques DASEN. Jadavion Spoelstra, MD   Outpatient Encounter Medications as of 04/07/2024  Medication Sig   amLODipine  (NORVASC ) 5 MG tablet Take 1 tablet (5 mg total) by mouth daily. for blood pressure.   aspirin-acetaminophen -caffeine  (EXCEDRIN MIGRAINE) 250-250-65 MG per tablet Take 1 tablet by mouth every 6 (six) hours as needed for pain.   celecoxib  (CELEBREX ) 200 MG capsule Take 1 capsule (200 mg total) by mouth daily.   ondansetron  (ZOFRAN -ODT) 4 MG disintegrating tablet Take 1 tablet (4 mg total) by mouth every 8 (eight) hours as needed for nausea or vomiting.   rizatriptan  (MAXALT ) 5 MG tablet Take 1 tablet by mouth at migraine onset. May repeat in 2 hours if needed   scopolamine  (TRANSDERM-SCOP) 1 MG/3DAYS Place 1 patch (1 mg total) onto the  skin every 3 (three) days.   traZODone  (DESYREL ) 50 MG tablet Take 50 mg by mouth at bedtime as needed.   [DISCONTINUED] predniSONE  (DELTASONE ) 20 MG tablet Take 3 tablets by mouth daily x 3 days, then 2 tablets x 3 days, then 1 tablet for 3 days.   [EXPIRED] triamcinolone  acetonide (KENALOG -40) injection 40 mg    No facility-administered encounter medications on file as of 04/07/2024.   "

## 2024-04-07 ENCOUNTER — Ambulatory Visit: Admitting: Family Medicine

## 2024-04-07 ENCOUNTER — Ambulatory Visit
Admission: RE | Admit: 2024-04-07 | Discharge: 2024-04-07 | Disposition: A | Discharge status: Home / Self Care | Source: Ambulatory Visit | Attending: Family Medicine | Admitting: Family Medicine

## 2024-04-07 ENCOUNTER — Ambulatory Visit: Admitting: Student

## 2024-04-07 ENCOUNTER — Encounter: Payer: Self-pay | Admitting: Family Medicine

## 2024-04-07 VITALS — BP 115/78 | HR 87 | Ht 70.0 in | Wt 311.0 lb

## 2024-04-07 VITALS — BP 130/84 | HR 83 | Temp 98.8°F | Ht 70.0 in | Wt 312.0 lb

## 2024-04-07 DIAGNOSIS — N62 Hypertrophy of breast: Secondary | ICD-10-CM | POA: Diagnosis not present

## 2024-04-07 DIAGNOSIS — M25561 Pain in right knee: Secondary | ICD-10-CM

## 2024-04-07 DIAGNOSIS — Z6841 Body Mass Index (BMI) 40.0 and over, adult: Secondary | ICD-10-CM

## 2024-04-07 DIAGNOSIS — G8929 Other chronic pain: Secondary | ICD-10-CM

## 2024-04-07 DIAGNOSIS — M79601 Pain in right arm: Secondary | ICD-10-CM

## 2024-04-07 DIAGNOSIS — M7121 Synovial cyst of popliteal space [Baker], right knee: Secondary | ICD-10-CM

## 2024-04-07 MED ORDER — TRIAMCINOLONE ACETONIDE 40 MG/ML IJ SUSP
40.0000 mg | Freq: Once | INTRAMUSCULAR | Status: AC
  Administered 2024-04-07: 40 mg via INTRA_ARTICULAR

## 2024-04-07 MED ORDER — CELECOXIB 200 MG PO CAPS: 200.0000 mg | ORAL_CAPSULE | Freq: Every day | ORAL | 2 refills | Status: AC

## 2024-04-07 NOTE — Progress Notes (Signed)
 "   Referring Provider Michelle Thomas POUR, NP [address] [place],  [place] [address]   CC: Follow Up     Michelle Thomas  is an 49 y.o. female.  HPI: Patient is a 49 year old female who presents to the clinic today for follow-up for a weight check.  Patient was seen for initial consult by Dr. Lowery on 11/10/2023.  At this visit, patient complained of upper back and neck pain due to enlarged breast.  On exam, her STN on the right was 38 cm and her STN on the left was 37 cm.  Patient's weight was 305 pounds and her BMI was 45 kg/m.  Her preoperative bra size was a 46 DDD cup.  The amount of excess breast tissue to be removed at the time of surgery was 1000 g for each side, but it was noted that she might need estimated 1900 g removed in order to be approved for her surgery.  Plan was for patient to follow-up with healthy weight and wellness and come back for reevaluation in about 3 months.  It was estimated that she would need to lose 20 to 50 pounds in order to be eligible for surgery.  Patient was otherwise a good candidate for bilateral breast reduction.  Today, patient reports she is doing well.  She states that she was recently started on tirzepatide for weight loss and has been working on her diet and exercising.  She states that she has been working with her provider on her weight loss.  She denies any other issues or concerns today.  Review of Systems General: Does not report any changes in health.  Physical Exam    04/07/2024    4:10 PM 04/07/2024    2:10 PM 03/04/2024    2:31 PM  Vitals with BMI  Height 5' 10 5' 10 5' 10  Weight 311 lbs 312 lbs 308 lbs  BMI 44.62 44.77 44.19  Systolic 115 130 883  Diastolic 78 84 76  Pulse 87 83 62    General:  No acute distress,  Alert and oriented, Non-Toxic, Normal speech and affect Pulm: Unlabored breathing, no respiratory distress MSK: Ambulatory Psych: Normal mood, normal behavior  Assessment/Plan  Symptomatic mammary  hypertrophy   Discussed with the patient that her weight to go up a little bit since her previous visit.  Encouraged her to continue to work with her provider on weight loss.  She states that she did not go to healthy weight and wellness, but I discussed with her that if she wants a referral to healthy weight and wellness at any point she can let us  know.  Patient declined referral today since she is working with a provider at this time.  I discussed with the patient that it is estimated that 1000 g could be safely removed from each of her breasts.  I discussed with her that given her body surface area, her insurance company may not approve this.  We discussed that decreasing her weight could help with this.  We also discussed that having a BMI less than 40 kg/m.  Would help decrease the risk of postoperative complications.  I discussed with her that she would need to be a weight of 264 pounds to be less then BMI of 40 kg/m.  We discussed the that even if she reaches a BMI of less than 40, it is not guaranteed that her surgery would be approved by insurance.  She expressed understanding of this.  Patient  was also inquiring about the out-of-pocket cost for a breast reduction.  Michelle Thomas will send her a quote.  Recommended that patient work on weight loss for about 6 months and follow back up with Dr. Lowery at that time for reevaluation.  Patient was in agreement with this plan.  I instructed the patient to call if she has any questions or concerns about anything.  Michelle Thomas 04/07/2024, 4:51 PM         "

## 2024-04-08 ENCOUNTER — Encounter: Payer: Self-pay | Admitting: Family Medicine

## 2024-04-12 ENCOUNTER — Encounter: Payer: Self-pay | Admitting: Neurology

## 2024-04-12 ENCOUNTER — Ambulatory Visit: Admitting: Neurology

## 2024-04-12 VITALS — BP 131/87 | HR 98 | Ht 70.0 in | Wt 311.0 lb

## 2024-04-12 DIAGNOSIS — G8929 Other chronic pain: Secondary | ICD-10-CM | POA: Diagnosis not present

## 2024-04-12 DIAGNOSIS — R202 Paresthesia of skin: Secondary | ICD-10-CM | POA: Diagnosis not present

## 2024-04-12 DIAGNOSIS — M25512 Pain in left shoulder: Secondary | ICD-10-CM

## 2024-04-12 NOTE — Progress Notes (Signed)
 "  Chief Complaint  Patient presents with   New Patient (Initial Visit)    Room 14 Alone Internal referral for BUE weakness and paresthesias      ASSESSMENT AND PLAN  Michelle Thomas  is a 49 y.o. female   Left arm paresthesia  Likely related to her shoulder pain, mild limited range of motion  Suggest left upper extremity shoulder stretching, warm compression, massage, as needed NSAIDs  DIAGNOSTIC DATA (LABS, IMAGING, TESTING) - I reviewed patient records, labs, notes, testing and imaging myself where available.   MEDICAL HISTORY:  Michelle Thomas , is a 49 year old female seen in request by her primary care NP  Gretta Crank for evaluation of left shoulder discomfort, radiating paresthesia to left upper extremity    History is obtained from the patient and review of electronic medical records. I personally reviewed pertinent available imaging films in PACS.   PMHx of  HTN Chronic insomnia Chronic migraine Obesity Right knee pain, Left plantar fascitis   She has obesity, works health and safety inspector job, typing all day long, around November 2025, with no clear triggers, she noticed intermittent discomfort radiating from left shoulder downwards involving whole left arm, symptoms can last 1 day, have subjective weakness she is symptomatic.  She denies significant neck pain, mild gait issues due to right knee pain, and left fasciitis  She was seen by [facility] already, had a EMG nerve conduction study, was diagnosed with mild bilateral carpal tunnel syndrome, decided on conservative treatment, giving a prescription of Celebrex  as needed  Today's examination showed mild to moderate tenderness of left shoulder upon deep palpitation, limited range of motion  PHYSICAL EXAM:   Vitals:   04/12/24 1521  BP: 131/87  Pulse: 98  SpO2: 97%  Weight: (!) 311 lb (141.1 kg)  Height: 5' 10 (1.778 m)    Body mass index is 44.62 kg/m.  PHYSICAL EXAMNIATION:  Gen: NAD,  conversant, well nourised, well groomed                     Cardiovascular: Regular rate rhythm, no peripheral edema, warm, nontender. Eyes: Conjunctivae clear without exudates or hemorrhage Neck: Supple, no carotid bruits. Pulmonary: Clear to auscultation bilaterally   NEUROLOGICAL EXAM:  MENTAL STATUS: Speech/cognition: Awake, alert, oriented to history taking and casual conversation CRANIAL NERVES: CN II: Visual fields are full to confrontation. Pupils are round equal and briskly reactive to light. CN III, IV, VI: extraocular movement are normal. No ptosis. CN V: Facial sensation is intact to light touch CN VII: Face is symmetric with normal eye closure  CN VIII: Hearing is normal to causal conversation. CN IX, X: Phonation is normal. CN XI: Head turning and shoulder shrug are intact  MOTOR: There is no pronator drift of out-stretched arms. Muscle bulk and tone are normal. Muscle strength is normal.  REFLEXES: Reflexes are 2+ and symmetric at the biceps, triceps, knees, and ankles. Plantar responses are flexor.  SENSORY: Intact to light touch, pinprick and vibratory sensation are intact in fingers and toes.  COORDINATION: There is no trunk or limb dysmetria noted.  GAIT/STANCE: Posture is normal. Gait is steady  . REVIEW OF SYSTEMS:  Full 14 system review of systems performed and notable only for as above All other review of systems were negative.   ALLERGIES: Allergies[1]  HOME MEDICATIONS: Current Outpatient Medications  Medication Sig Dispense Refill   amLODipine  (NORVASC ) 5 MG tablet Take 1 tablet (5 mg total) by mouth daily. for blood pressure. 90 tablet  1   aspirin-acetaminophen -caffeine  (EXCEDRIN MIGRAINE) 250-250-65 MG per tablet Take 1 tablet by mouth every 6 (six) hours as needed for pain.     celecoxib  (CELEBREX ) 200 MG capsule Take 1 capsule (200 mg total) by mouth daily. 30 capsule 2   ondansetron  (ZOFRAN -ODT) 4 MG disintegrating tablet Take 1 tablet  (4 mg total) by mouth every 8 (eight) hours as needed for nausea or vomiting. 20 tablet 0   rizatriptan  (MAXALT ) 5 MG tablet Take 1 tablet by mouth at migraine onset. May repeat in 2 hours if needed 10 tablet 0   scopolamine  (TRANSDERM-SCOP) 1 MG/3DAYS Place 1 patch (1 mg total) onto the skin every 3 (three) days. 10 patch 0   traZODone  (DESYREL ) 50 MG tablet Take 50 mg by mouth at bedtime as needed.     No current facility-administered medications for this visit.    PAST MEDICAL HISTORY: Past Medical History:  Diagnosis Date   Hypertension    Migraine without aura, without mention of intractable migraine without mention of status migrainosus 06/18/2012   Obesity    Persistent cough for 3 weeks or longer 07/29/2023    PAST SURGICAL HISTORY: Past Surgical History:  Procedure Laterality Date   LAPAROSCOPY N/A 05/03/2018   Procedure: LAPAROSCOPY DIAGNOSTIC,;  Surgeon: Leva Rush, MD;  Location: [facility];  Service: Gynecology;  Laterality: N/A;   OVARIAN CYST REMOVAL Left 05/03/2018   Procedure: LEFT OVARIAN CYSTECTOMY;  Surgeon: Leva Rush, MD;  Location: [facility];  Service: Gynecology;  Laterality: Left;   PLANTAR FASCIA RELEASE Left    WISDOM TOOTH EXTRACTION      FAMILY HISTORY: Family History  Problem Relation Age of Onset   Hypertension Mother    Hypertension Father    Diabetes Father    Cancer Father        colon cancer   Headache Sister    Diabetes Sister    Breast cancer Cousin     SOCIAL HISTORY: Social History   Socioeconomic History   Marital status: Single    Spouse name: Not on file   Number of children: 0   Years of education: BA   Highest education level: Bachelor's degree (e.g., BA, AB, BS)  Occupational History   Occupation: Hydrologist: [employer]  Tobacco Use   Smoking status: Never   Smokeless tobacco: Never  Vaping Use   Vaping status: Never Used  Substance and Sexual  Activity   Alcohol use: Yes    Comment: occasionally   Drug use: No   Sexual activity: Yes    Birth control/protection: None    Comment: partner vasectomy  Other Topics Concern   Not on file  Social History Narrative   Not on file   Social Drivers of Health   Tobacco Use: Low Risk (04/12/2024)   Patient History    Smoking Tobacco Use: Never    Smokeless Tobacco Use: Never    Passive Exposure: Not on file  Financial Resource Strain: Low Risk (03/04/2024)   Overall Financial Resource Strain (CARDIA)    Difficulty of Paying Living Expenses: Not very hard  Food Insecurity: No Food Insecurity (03/04/2024)   Epic    Worried About Radiation Protection Practitioner of Food in the Last Year: Never true    Ran Out of Food in the Last Year: Never true  Transportation Needs: No Transportation Needs (03/04/2024)   Epic    Lack of Transportation (Medical): No    Lack  of Transportation (Non-Medical): No  Physical Activity: Insufficiently Active (03/04/2024)   Exercise Vital Sign    Days of Exercise per Week: 2 days    Minutes of Exercise per Session: 30 min  Stress: Stress Concern Present (03/04/2024)   [employer] of Occupational Health - Occupational Stress Questionnaire    Feeling of Stress: To some extent  Social Connections: Moderately Isolated (03/04/2024)   Social Connection and Isolation Panel    Frequency of Communication with Friends and Family: More than three times a week    Frequency of Social Gatherings with Friends and Family: Twice a week    Attends Religious Services: Never    Database Administrator or Organizations: Yes    Attends Banker Meetings: Never    Marital Status: Never married  Intimate Partner Violence: Not on file  Depression (PHQ2-9): Medium Risk (03/04/2024)   Depression (PHQ2-9)    PHQ-2 Score: 6  Alcohol Screen: Low Risk (03/04/2024)   Alcohol Screen    Last Alcohol Screening Score (AUDIT): 2  Housing: Low Risk (03/04/2024)   Epic    Unable to Pay for  Housing in the Last Year: No    Number of Times Moved in the Last Year: 0    Homeless in the Last Year: No  Utilities: Not on file  Health Literacy: Not on file      Modena Callander, M.D. Ph.D.  [facility] [address], [address] [place], [place] [address] Ph: [phone] Fax: [phone]  CC:  [name], NP [address] [place],  [place] [address]  [name], NP      [1]  Allergies Allergen Reactions   Codeine     Rash   Penicillin V     Other reaction(s): unknown   Penicillins Cross Reactors Hives   Sumatriptan      Other reaction(s): burning sensation, sweating   "

## 2024-10-07 ENCOUNTER — Ambulatory Visit: Admitting: Plastic Surgery
